# Patient Record
Sex: Female | Born: 1945 | Race: White | Hispanic: No | Smoking: Former smoker
Health system: Southern US, Community
[De-identification: ages and names within clinical notes are randomized; demographics above are authoritative.]

## PROBLEM LIST (undated history)

## (undated) DIAGNOSIS — E785 Hyperlipidemia, unspecified: Secondary | ICD-10-CM

## (undated) DIAGNOSIS — K279 Peptic ulcer, site unspecified, unspecified as acute or chronic, without hemorrhage or perforation: Secondary | ICD-10-CM

## (undated) HISTORY — PX: ABDOMINAL HYSTERECTOMY: SHX81

## (undated) HISTORY — DX: Peptic ulcer, site unspecified, unspecified as acute or chronic, without hemorrhage or perforation: K27.9

## (undated) HISTORY — DX: Hyperlipidemia, unspecified: E78.5

## (undated) HISTORY — PX: APPENDECTOMY: SHX54

## (undated) HISTORY — PX: OTHER SURGICAL HISTORY: SHX169

## (undated) HISTORY — PX: TONSILLECTOMY: SUR1361

---

## 1998-07-23 ENCOUNTER — Ambulatory Visit (HOSPITAL_COMMUNITY): Admission: RE | Admit: 1998-07-23 | Discharge: 1998-07-23 | Payer: Self-pay | Admitting: Gastroenterology

## 1998-07-26 ENCOUNTER — Ambulatory Visit (HOSPITAL_COMMUNITY): Admission: RE | Admit: 1998-07-26 | Discharge: 1998-07-26 | Payer: Self-pay | Admitting: Gastroenterology

## 1998-07-26 ENCOUNTER — Encounter: Payer: Self-pay | Admitting: Gastroenterology

## 1998-09-25 ENCOUNTER — Other Ambulatory Visit: Admission: RE | Admit: 1998-09-25 | Discharge: 1998-09-25 | Payer: Self-pay | Admitting: Obstetrics & Gynecology

## 2003-08-30 ENCOUNTER — Other Ambulatory Visit: Admission: RE | Admit: 2003-08-30 | Discharge: 2003-08-30 | Payer: Self-pay | Admitting: Obstetrics & Gynecology

## 2004-08-06 ENCOUNTER — Emergency Department (HOSPITAL_COMMUNITY): Admission: EM | Admit: 2004-08-06 | Discharge: 2004-08-06 | Payer: Self-pay | Admitting: Emergency Medicine

## 2007-08-16 ENCOUNTER — Emergency Department (HOSPITAL_COMMUNITY): Admission: EM | Admit: 2007-08-16 | Discharge: 2007-08-16 | Payer: Self-pay | Admitting: Emergency Medicine

## 2008-05-15 ENCOUNTER — Ambulatory Visit: Payer: Self-pay | Admitting: Internal Medicine

## 2008-05-17 ENCOUNTER — Encounter: Admission: RE | Admit: 2008-05-17 | Discharge: 2008-05-17 | Payer: Self-pay | Admitting: Internal Medicine

## 2008-05-29 ENCOUNTER — Encounter: Admission: RE | Admit: 2008-05-29 | Discharge: 2008-05-29 | Payer: Self-pay | Admitting: Internal Medicine

## 2008-12-20 ENCOUNTER — Encounter: Admission: RE | Admit: 2008-12-20 | Discharge: 2008-12-20 | Payer: Self-pay | Admitting: Orthopedic Surgery

## 2009-02-26 LAB — HM COLONOSCOPY

## 2010-02-09 ENCOUNTER — Emergency Department (HOSPITAL_COMMUNITY): Admission: EM | Admit: 2010-02-09 | Discharge: 2010-02-09 | Payer: Self-pay | Admitting: Pediatrics

## 2010-02-13 ENCOUNTER — Encounter: Admission: RE | Admit: 2010-02-13 | Discharge: 2010-02-13 | Payer: Self-pay | Admitting: Gastroenterology

## 2010-02-20 ENCOUNTER — Ambulatory Visit (HOSPITAL_COMMUNITY): Admission: RE | Admit: 2010-02-20 | Discharge: 2010-02-20 | Payer: Self-pay | Admitting: Gastroenterology

## 2010-06-09 ENCOUNTER — Encounter: Payer: Self-pay | Admitting: Orthopedic Surgery

## 2010-06-09 ENCOUNTER — Encounter: Payer: Self-pay | Admitting: Internal Medicine

## 2010-08-01 LAB — COMPREHENSIVE METABOLIC PANEL
ALT: 11 U/L (ref 0–35)
AST: 15 U/L (ref 0–37)
Albumin: 3.7 g/dL (ref 3.5–5.2)
Alkaline Phosphatase: 79 U/L (ref 39–117)
BUN: 14 mg/dL (ref 6–23)
CO2: 26 mEq/L (ref 19–32)
Calcium: 9.2 mg/dL (ref 8.4–10.5)
Chloride: 108 mEq/L (ref 96–112)
Creatinine, Ser: 0.63 mg/dL (ref 0.4–1.2)
GFR calc Af Amer: >60 mL/min (ref >60)
GFR calc non Af Amer: >60 mL/min (ref >60)
Glucose, Bld: 101 mg/dL — ABNORMAL HIGH (ref 70–99)
Potassium: 4.6 mEq/L (ref 3.5–5.1)
Sodium: 140 mEq/L (ref 135–145)
Total Bilirubin: 0.4 mg/dL (ref 0.3–1.2)
Total Protein: 7 g/dL (ref 6.0–8.3)

## 2010-08-01 LAB — CBC
HCT: 38.4 % (ref 36.0–46.0)
Hemoglobin: 13.4 g/dL (ref 12.0–15.0)
MCH: 29.8 pg (ref 26.0–34.0)
MCHC: 34.8 g/dL (ref 30.0–36.0)
MCV: 85.5 fL (ref 78.0–100.0)
Platelets: 265 10*3/uL (ref 150–400)
RBC: 4.5 MIL/uL (ref 3.87–5.11)
RDW: 12.8 % (ref 11.5–15.5)
WBC: 6.9 10*3/uL (ref 4.0–10.5)

## 2010-08-01 LAB — DIFFERENTIAL
Basophils Absolute: 0 10*3/uL (ref 0.0–0.1)
Basophils Relative: 0 % (ref 0–1)
Eosinophils Absolute: 0 10*3/uL (ref 0.0–0.7)
Eosinophils Relative: 0 % (ref 0–5)
Lymphocytes Relative: 15 % (ref 12–46)
Lymphs Abs: 1 10*3/uL (ref 0.7–4.0)
Monocytes Absolute: 0.2 10*3/uL (ref 0.1–1.0)
Monocytes Relative: 2 % — ABNORMAL LOW (ref 3–12)
Neutro Abs: 5.6 10*3/uL (ref 1.7–7.7)
Neutrophils Relative %: 82 % — ABNORMAL HIGH (ref 43–77)

## 2010-08-01 LAB — LIPASE, BLOOD: Lipase: 29 U/L (ref 11–59)

## 2010-10-07 IMAGING — CR DG CHEST 2V
2 series · 2 of 2 positions shown · non-contrast
Comparison: 08/16/2007

CLINICAL DATA: Visual disturbance with headaches and cough.

CHEST - 2 VIEW

[view not recorded (1 of 2)]
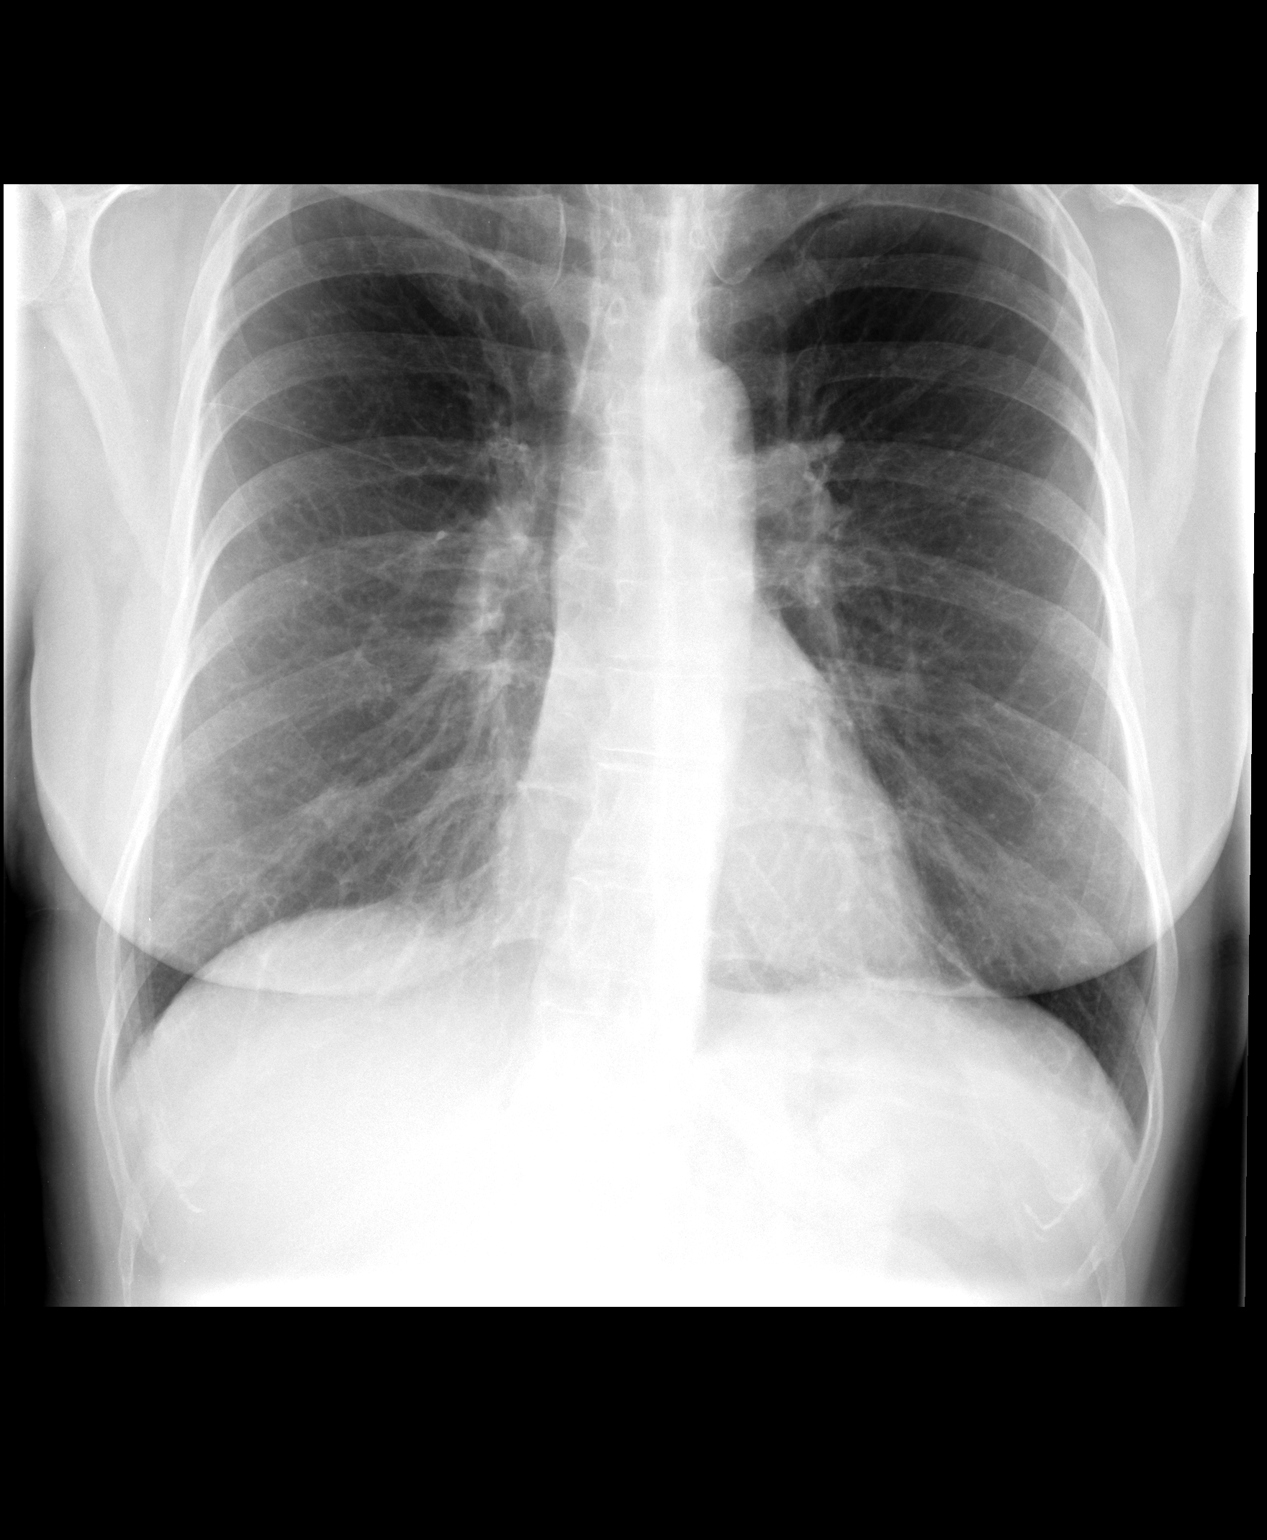

[view not recorded (2 of 2)]
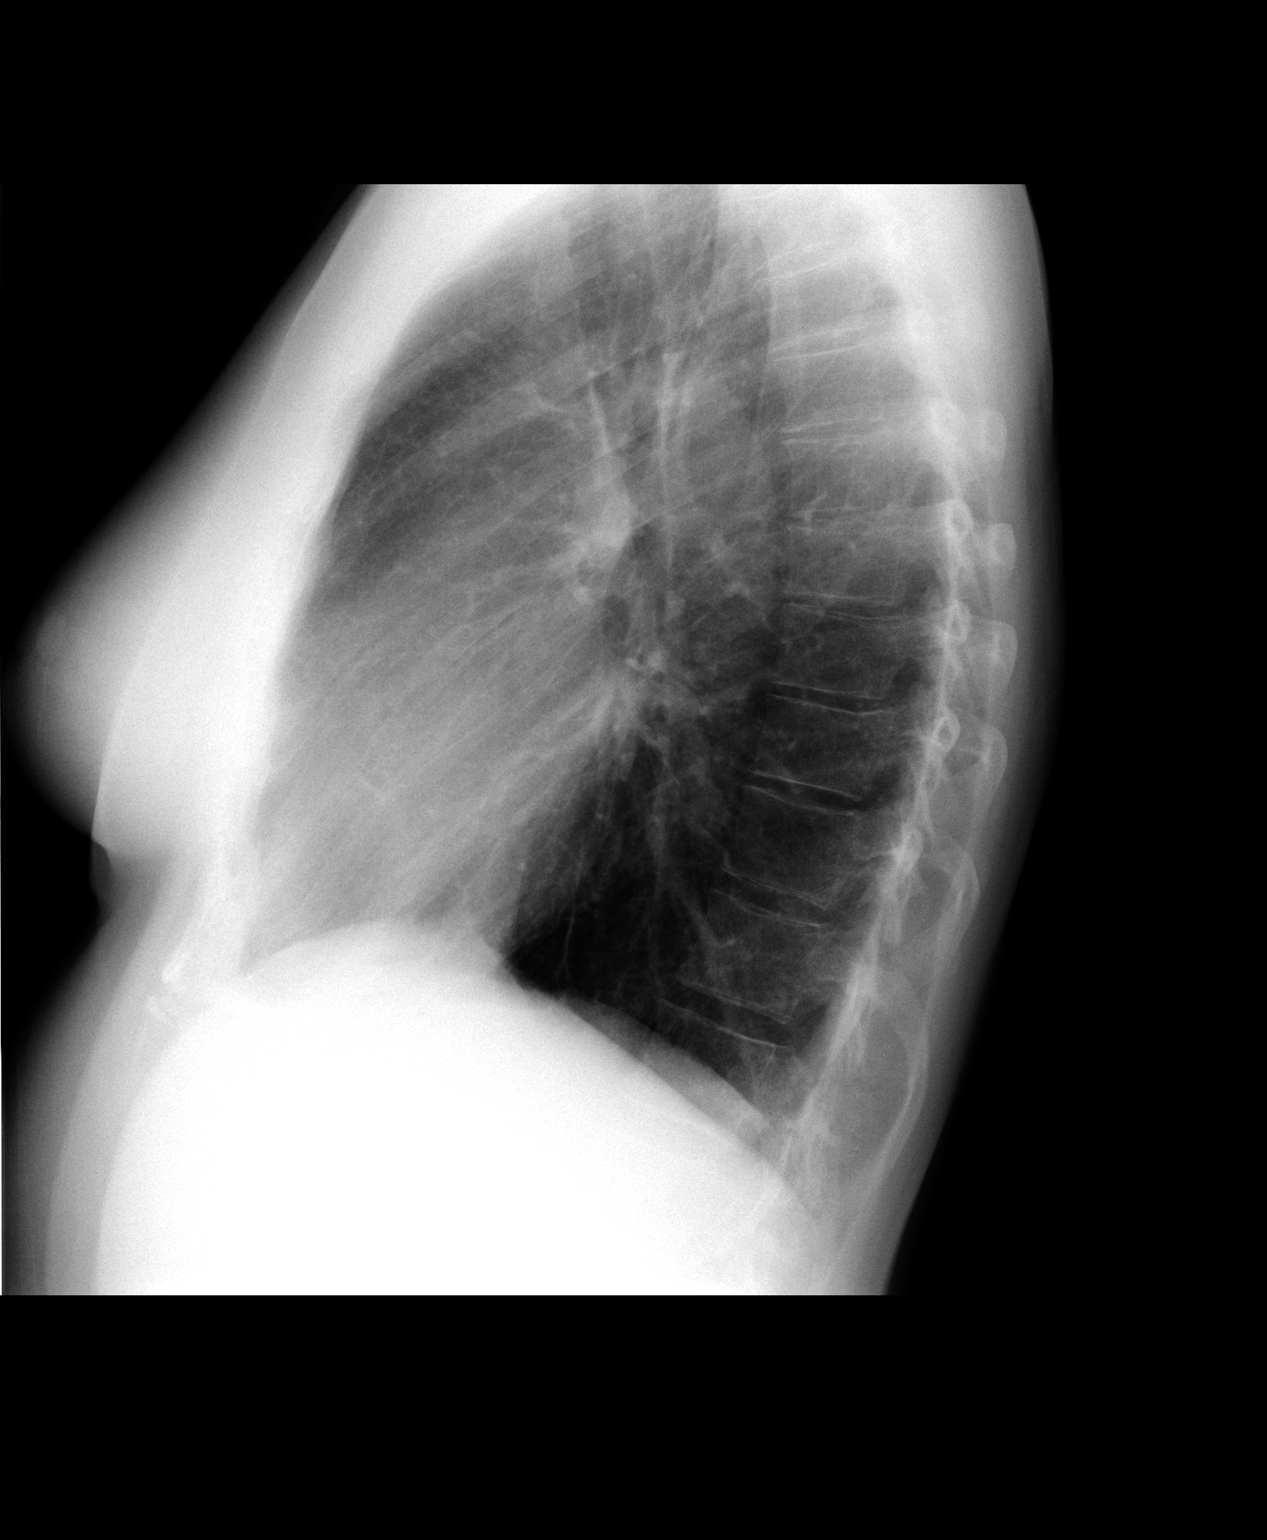

[2 of 2 positions shown; findings below may reference images not displayed]

FINDINGS: Trachea is midline.  Heart size normal.  There is
biapical pleural parenchymal scarring, with slight retraction of
the minor fissure.  Added density is seen in the right lower lung
zone, at the junction of the right 5th anterior and right tenth
posterior ribs.  It is unchanged from the previous study.  Minimal
left basilar scarring.
IMPRESSION: 1.  No acute findings.
2.  Probable summation shadow in the right lower lung zone.  Chest
CT could be performed in further evaluation, as clinically
indicated.

## 2010-10-07 IMAGING — CT CT HEAD WO/W CM
1 of 2 series · 13 of 30 positions shown, 17 images · IV contrast (75CC OMNI 300)
Comparison: CT brain 08/06/2004

CLINICAL DATA: Headaches, visual disturbance for several months
with blurred vision

CT HEAD WITHOUT AND WITH CONTRAST
TECHNIQUE: Contiguous axial images were obtained from the base of
the skull through the vertex without and with intravenous contrast.
Contrast: 75 ml Fmnipaque-OTT

[Series 2: head · axial · 0.49mm/px · z∈[-7,+119]mm · 13 of 28 slices shown, 17 images]
[im 2/28  brain]
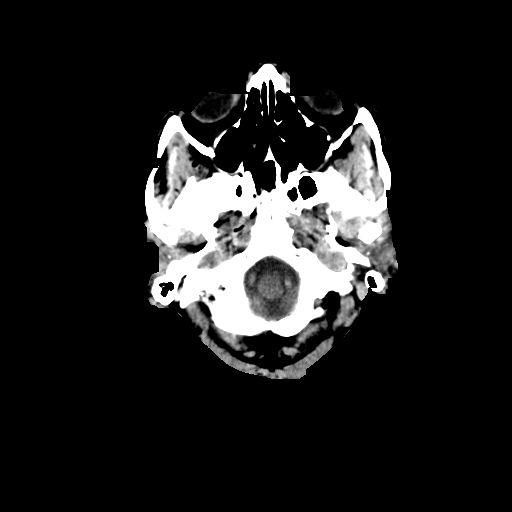
[im 2/28  bone]
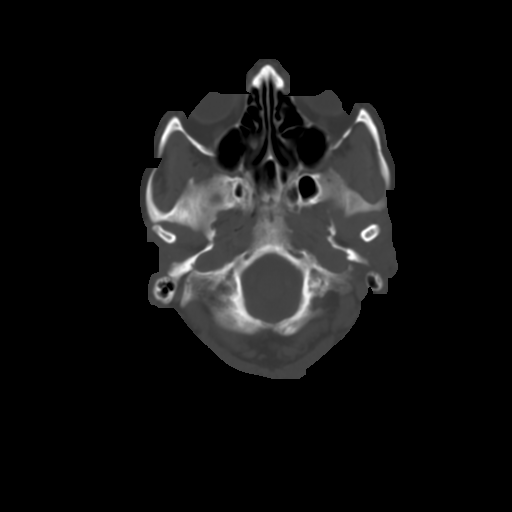
[im 4/28  brain]
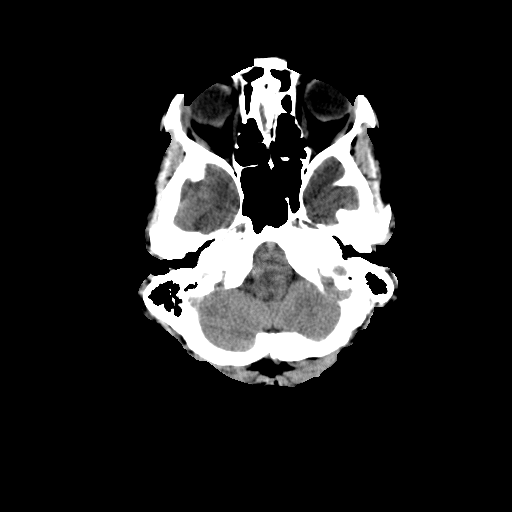
[im 6/28  brain]
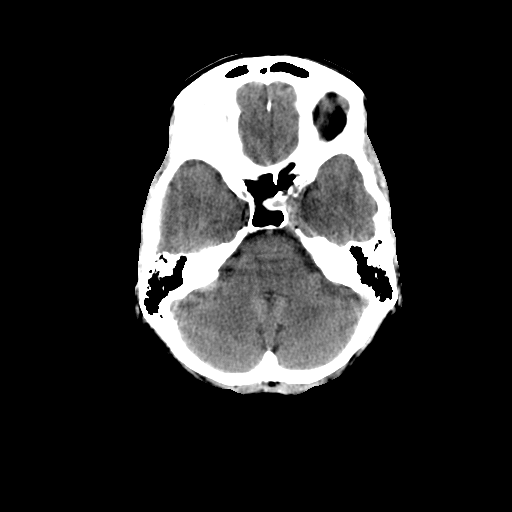
[im 8/28  brain]
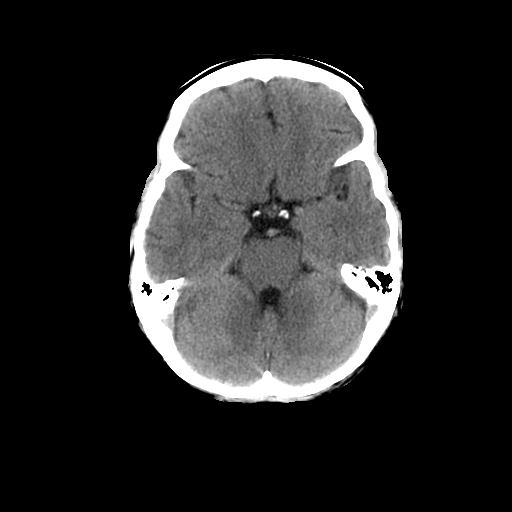
[im 10/28  brain]
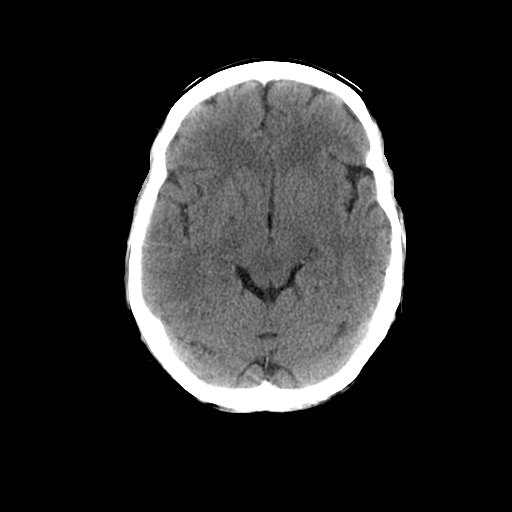
[im 10/28  bone]
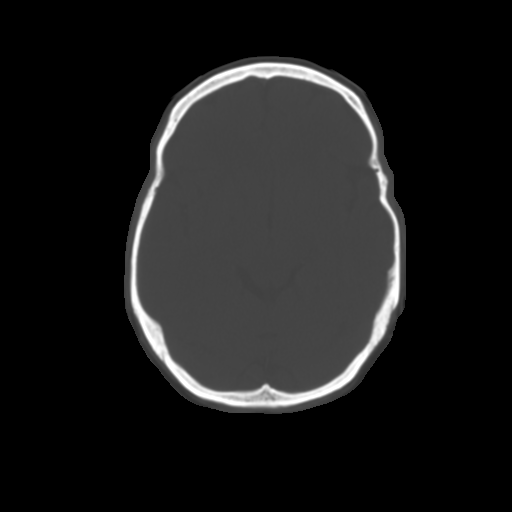
[im 12/28  brain]
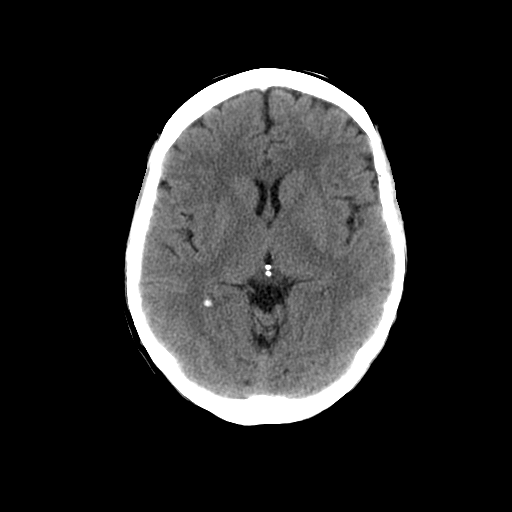
[im 14/28  brain]
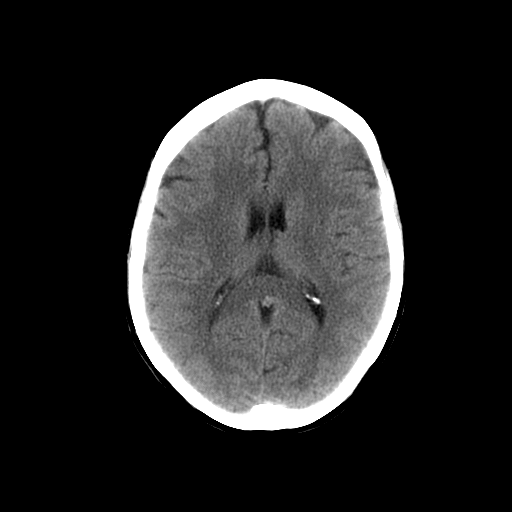
[im 16/28  brain]
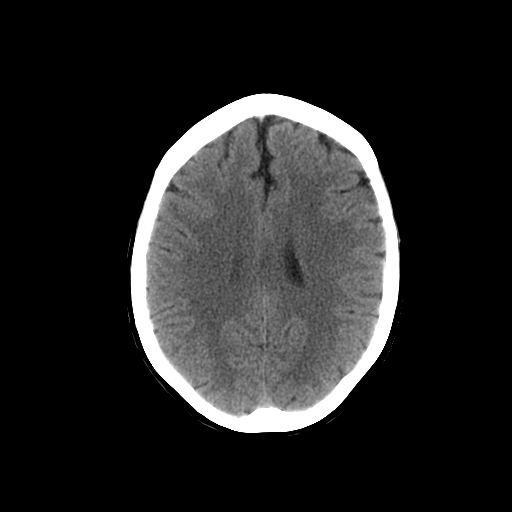
[im 18/28  brain]
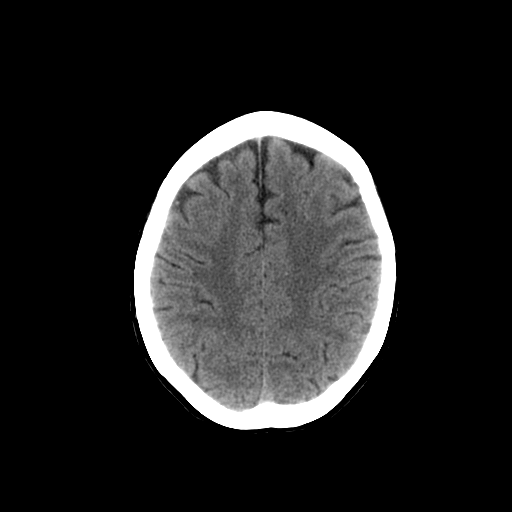
[im 18/28  bone]
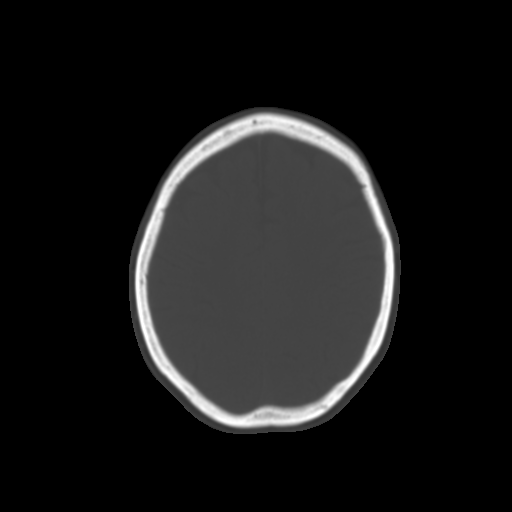
[im 20/28  brain]
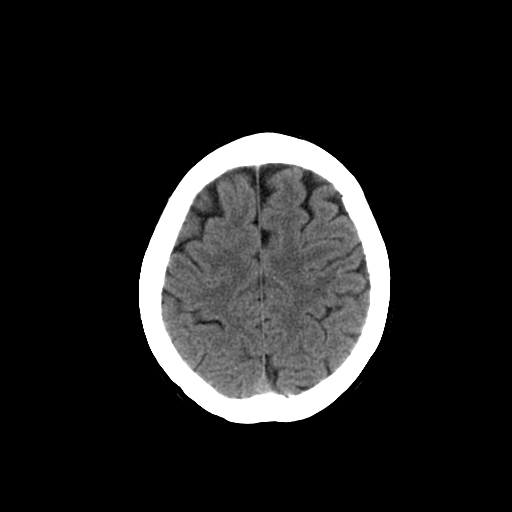
[im 22/28  brain]
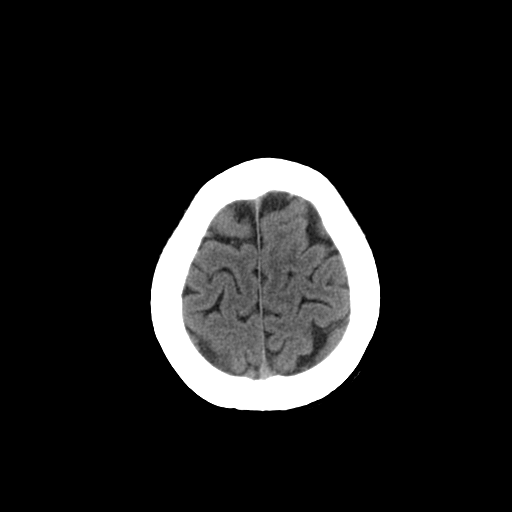
[im 24/28  brain]
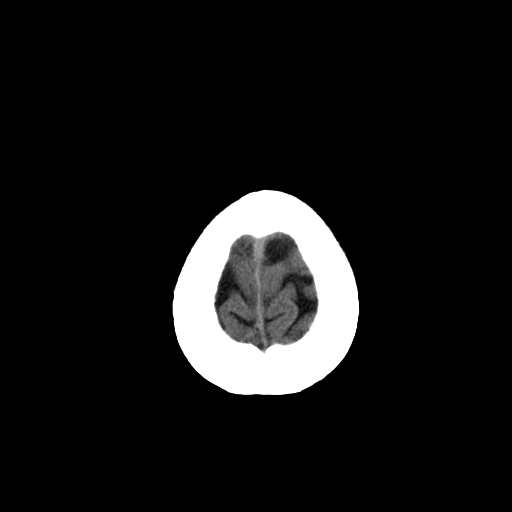
[im 26/28  brain]
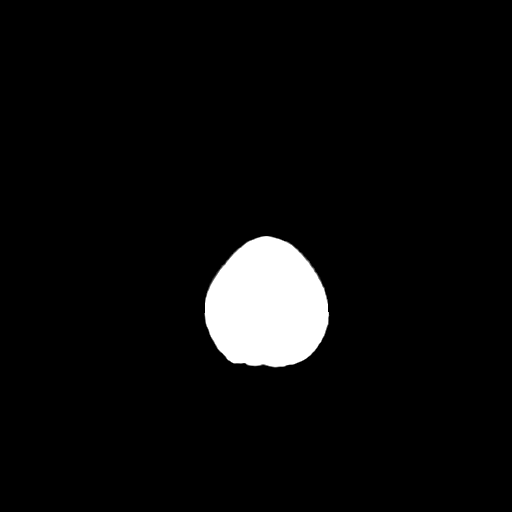
[im 26/28  bone]
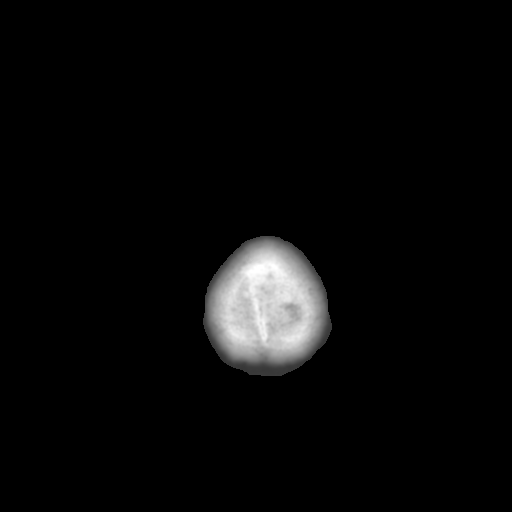

[13 of 30 positions shown; findings below may reference images not displayed]

FINDINGS: The ventricular system is normal in size and
configuration, and the septum is in a normal midline position.  The
fourth ventricle and basilar cisterns appear normal.  No blood,
edema, or mass effect is seen.

After contrast administration, no enhancing lesion is seen.  There
is a rounded area of enhancement  on unenhanced image #9 probably
representing the uppermost portion of the left internal carotid
artery.  However in view of the symptoms, MRI of the brain with MRA
may be helpful to assess further for possible aneurysm.  On bone
window images there is mucosal thickening in the left maxillary
sinus consistent with maxillary sinus disease.  No air-fluid level
is seen.  No bony abnormality is noted.
IMPRESSION: 1.  No acute intracranial abnormality.
2.  Rounded area of enhancement in the region of the left circle
Willis possibly representing a portion of the left internal carotid
artery.  However an aneurysm cannot be excluded and MR of the brain
with MRA is recommended to evaluate further.
3.  Mucosal thickening in the left maxillary sinus.

## 2011-07-03 ENCOUNTER — Encounter: Payer: Self-pay | Admitting: Cardiology

## 2011-07-08 ENCOUNTER — Other Ambulatory Visit: Payer: Self-pay | Admitting: Obstetrics & Gynecology

## 2011-07-08 DIAGNOSIS — R928 Other abnormal and inconclusive findings on diagnostic imaging of breast: Secondary | ICD-10-CM

## 2011-07-09 ENCOUNTER — Ambulatory Visit
Admission: RE | Admit: 2011-07-09 | Discharge: 2011-07-09 | Disposition: A | Discharge status: Home / Self Care | Payer: BC Managed Care – PPO | Source: Ambulatory Visit | Attending: Obstetrics & Gynecology | Admitting: Obstetrics & Gynecology

## 2011-07-09 DIAGNOSIS — R928 Other abnormal and inconclusive findings on diagnostic imaging of breast: Secondary | ICD-10-CM

## 2011-08-01 ENCOUNTER — Encounter: Payer: Self-pay | Admitting: Cardiology

## 2011-08-01 ENCOUNTER — Ambulatory Visit (INDEPENDENT_AMBULATORY_CARE_PROVIDER_SITE_OTHER): Payer: BC Managed Care – PPO | Admitting: Cardiology

## 2011-08-01 DIAGNOSIS — R079 Chest pain, unspecified: Secondary | ICD-10-CM | POA: Insufficient documentation

## 2011-08-01 DIAGNOSIS — E785 Hyperlipidemia, unspecified: Secondary | ICD-10-CM

## 2011-08-01 NOTE — Progress Notes (Signed)
  HPI: 66 year old female with no prior cardiac history for evaluation of chest pain. Patient denies dyspnea on exertion, orthopnea, PND, pedal edema, palpitations, syncope or exertional chest pain. She and her fianc have been remodeling his home. Approximately one to 2 months ago she was pulling up carpet. The following day she developed sharp pain in her chest. The pain increased with raising her arms over her head. It lasted 2 days continuously and slowly resolved with aspirin and a heating pad. No associated symptoms. No radiation. She had a second episode again after pulling up carpet. Because of the above we were asked to further evaluate.  Current Outpatient Prescriptions  Medication Sig Dispense Refill  . aspirin 325 MG tablet Take 325 mg by mouth daily.      . Calcium Carbonate (CALTRATE 600 PO) Take 1,000 Units by mouth daily.      Marland Kitchen estradiol (ESTRACE) 1 MG tablet Take 1 mg by mouth daily.      Marland Kitchen omeprazole (PRILOSEC OTC) 20 MG tablet Take 10 mg by mouth daily.        No Known Allergies  Past Medical History  Diagnosis Date  . Hyperlipidemia   . PUD (peptic ulcer disease)     Past Surgical History  Procedure Date  . Tonsillectomy   . Abdominal hysterectomy   . Appendectomy   . Bladder tack     History   Social History  . Marital Status: Divorced    Spouse Name: N/A    Number of Children: 2   . Years of Education: N/A   Occupational History  .      Retired   Social History Main Topics  . Smoking status: Former Smoker    Quit date: 05/19/1990  . Smokeless tobacco: Not on file  . Alcohol Use: Yes     Rarely  . Drug Use: Not on file  . Sexually Active: Not on file   Other Topics Concern  . Not on file   Social History Narrative  . No narrative on file    Family History  Problem Relation Age of Onset  . Heart disease Father     Pacemaker  . Heart disease Mother     MVR; MI    ROS:  no fevers or chills, productive cough, hemoptysis, dysphasia,  odynophagia, melena, hematochezia, dysuria, hematuria, rash, seizure activity, orthopnea, PND, pedal edema, claudication. Remaining systems are negative.  Physical Exam:   Blood pressure 158/88, pulse 91, height 5\' 6"  (1.676 m), weight 134 lb (60.782 kg).  General:  Well developed/well nourished in NAD Skin warm/dry Patient not depressed No peripheral clubbing Back-normal HEENT-normal/normal eyelids Neck supple/normal carotid upstroke bilaterally; no bruits; no JVD; no thyromegaly chest - CTA/ normal expansion CV - RRR/normal S1 and S2; no murmurs, rubs or gallops;  PMI nondisplaced Abdomen -NT/ND, no HSM, no mass, + bowel sounds, no bruit 2+ femoral pulses, no bruits Ext-no edema, chords, 2+ DP Neuro-grossly nonfocal  ECG normal sinus rhythm at a rate of 91. Axis normal. Poor R-wave progression.

## 2011-08-01 NOTE — Assessment & Plan Note (Signed)
Symptoms are most consistent with musculoskeletal pain. She does not have other symptoms such as exertional chest pain or dyspnea on exertion. Electrocardiogram unremarkable. I do not think further cardiac workup is needed unless she has worsening symptoms in the future.

## 2011-08-01 NOTE — Assessment & Plan Note (Signed)
Management per primary care. 

## 2012-07-08 ENCOUNTER — Other Ambulatory Visit: Payer: Self-pay | Admitting: Obstetrics & Gynecology

## 2012-07-08 DIAGNOSIS — R928 Other abnormal and inconclusive findings on diagnostic imaging of breast: Secondary | ICD-10-CM

## 2012-07-15 ENCOUNTER — Ambulatory Visit
Admission: RE | Admit: 2012-07-15 | Discharge: 2012-07-15 | Disposition: A | Discharge status: Home / Self Care | Payer: BC Managed Care – PPO | Source: Ambulatory Visit | Attending: Obstetrics & Gynecology | Admitting: Obstetrics & Gynecology

## 2012-07-15 DIAGNOSIS — R928 Other abnormal and inconclusive findings on diagnostic imaging of breast: Secondary | ICD-10-CM

## 2013-06-29 ENCOUNTER — Ambulatory Visit (INDEPENDENT_AMBULATORY_CARE_PROVIDER_SITE_OTHER): Payer: Medicare HMO | Admitting: Family

## 2013-06-29 ENCOUNTER — Encounter: Payer: Self-pay | Admitting: Family

## 2013-06-29 VITALS — BP 112/84 | HR 78 | Ht 65.0 in | Wt 141.0 lb

## 2013-06-29 DIAGNOSIS — Z7189 Other specified counseling: Secondary | ICD-10-CM

## 2013-06-29 DIAGNOSIS — Z7689 Persons encountering health services in other specified circumstances: Secondary | ICD-10-CM

## 2013-06-29 DIAGNOSIS — R7689 Other specified abnormal immunological findings in serum: Secondary | ICD-10-CM | POA: Insufficient documentation

## 2013-06-29 DIAGNOSIS — R768 Other specified abnormal immunological findings in serum: Secondary | ICD-10-CM | POA: Insufficient documentation

## 2013-06-29 DIAGNOSIS — K219 Gastro-esophageal reflux disease without esophagitis: Secondary | ICD-10-CM

## 2013-06-29 DIAGNOSIS — B182 Chronic viral hepatitis C: Secondary | ICD-10-CM

## 2013-06-29 NOTE — Patient Instructions (Signed)
Fat and Cholesterol Control Diet  Fat and cholesterol levels in your blood and organs are influenced by your diet. High levels of fat and cholesterol may lead to diseases of the heart, small and large blood vessels, gallbladder, liver, and pancreas.  CONTROLLING FAT AND CHOLESTEROL WITH DIET  Although exercise and lifestyle factors are important, your diet is key. That is because certain foods are known to raise cholesterol and others to lower it. The goal is to balance foods for their effect on cholesterol and more importantly, to replace saturated and trans fat with other types of fat, such as monounsaturated fat, polyunsaturated fat, and omega-3 fatty acids.  On average, a person should consume no more than 15 to 17 g of saturated fat daily. Saturated and trans fats are considered "bad" fats, and they will raise LDL cholesterol. Saturated fats are primarily found in animal products such as meats, butter, and cream. However, that does not mean you need to give up all your favorite foods. Today, there are good tasting, low-fat, low-cholesterol substitutes for most of the things you like to eat. Choose low-fat or nonfat alternatives. Choose round or loin cuts of red meat. These types of cuts are lowest in fat and cholesterol. Chicken (without the skin), fish, veal, and ground turkey breast are great choices. Eliminate fatty meats, such as hot dogs and salami. Even shellfish have little or no saturated fat. Have a 3 oz (85 g) portion when you eat lean meat, poultry, or fish.  Trans fats are also called "partially hydrogenated oils." They are oils that have been scientifically manipulated so that they are solid at room temperature resulting in a longer shelf life and improved taste and texture of foods in which they are added. Trans fats are found in stick margarine, some tub margarines, cookies, crackers, and baked goods.   When baking and cooking, oils are a great substitute for butter. The monounsaturated oils are  especially beneficial since it is believed they lower LDL and raise HDL. The oils you should avoid entirely are saturated tropical oils, such as coconut and palm.   Remember to eat a lot from food groups that are naturally free of saturated and trans fat, including fish, fruit, vegetables, beans, grains (barley, rice, couscous, bulgur wheat), and pasta (without cream sauces).   IDENTIFYING FOODS THAT LOWER FAT AND CHOLESTEROL   Soluble fiber may lower your cholesterol. This type of fiber is found in fruits such as apples, vegetables such as broccoli, potatoes, and carrots, legumes such as beans, peas, and lentils, and grains such as barley. Foods fortified with plant sterols (phytosterol) may also lower cholesterol. You should eat at least 2 g per day of these foods for a cholesterol lowering effect.   Read package labels to identify low-saturated fats, trans fat free, and low-fat foods at the supermarket. Select cheeses that have only 2 to 3 g saturated fat per ounce. Use a heart-healthy tub margarine that is free of trans fats or partially hydrogenated oil. When buying baked goods (cookies, crackers), avoid partially hydrogenated oils. Breads and muffins should be made from whole grains (whole-wheat or whole oat flour, instead of "flour" or "enriched flour"). Buy non-creamy canned soups with reduced salt and no added fats.   FOOD PREPARATION TECHNIQUES   Never deep-fry. If you must fry, either stir-fry, which uses very little fat, or use non-stick cooking sprays. When possible, broil, bake, or roast meats, and steam vegetables. Instead of putting butter or margarine on vegetables, use lemon   yogurt, salsa, and low-fat dressings for salads.  LOW-SATURATED FAT / LOW-FAT FOOD SUBSTITUTES Meats / Saturated Fat (g)  Avoid: Steak, marbled (3 oz/85 g) / 11 g  Choose: Steak, lean (3 oz/85 g) / 4 g  Avoid: Hamburger (3 oz/85 g) / 7  g  Choose: Hamburger, lean (3 oz/85 g) / 5 g  Avoid: Ham (3 oz/85 g) / 6 g  Choose: Ham, lean cut (3 oz/85 g) / 2.4 g  Avoid: Chicken, with skin, dark meat (3 oz/85 g) / 4 g  Choose: Chicken, skin removed, dark meat (3 oz/85 g) / 2 g  Avoid: Chicken, with skin, light meat (3 oz/85 g) / 2.5 g  Choose: Chicken, skin removed, light meat (3 oz/85 g) / 1 g Dairy / Saturated Fat (g)  Avoid: Whole milk (1 cup) / 5 g  Choose: Low-fat milk, 2% (1 cup) / 3 g  Choose: Low-fat milk, 1% (1 cup) / 1.5 g  Choose: Skim milk (1 cup) / 0.3 g  Avoid: Hard cheese (1 oz/28 g) / 6 g  Choose: Skim milk cheese (1 oz/28 g) / 2 to 3 g  Avoid: Cottage cheese, 4% fat (1 cup) / 6.5 g  Choose: Low-fat cottage cheese, 1% fat (1 cup) / 1.5 g  Avoid: Ice cream (1 cup) / 9 g  Choose: Sherbet (1 cup) / 2.5 g  Choose: Nonfat frozen yogurt (1 cup) / 0.3 g  Choose: Frozen fruit bar / trace  Avoid: Whipped cream (1 tbs) / 3.5 g  Choose: Nondairy whipped topping (1 tbs) / 1 g Condiments / Saturated Fat (g)  Avoid: Mayonnaise (1 tbs) / 2 g  Choose: Low-fat mayonnaise (1 tbs) / 1 g  Avoid: Butter (1 tbs) / 7 g  Choose: Extra light margarine (1 tbs) / 1 g  Avoid: Coconut oil (1 tbs) / 11.8 g  Choose: Olive oil (1 tbs) / 1.8 g  Choose: Corn oil (1 tbs) / 1.7 g  Choose: Safflower oil (1 tbs) / 1.2 g  Choose: Sunflower oil (1 tbs) / 1.4 g  Choose: Soybean oil (1 tbs) / 2.4 g  Choose: Canola oil (1 tbs) / 1 g Document Released: 05/05/2005 Document Revised: 08/30/2012 Document Reviewed: 10/24/2010 [employer] Patient Information 2014 [employer].    Exercise to Stay Healthy Exercise helps you become and stay healthy. EXERCISE IDEAS AND TIPS Choose exercises that:  You enjoy.  Fit into your day. You do not need to exercise really hard to be healthy. You can do exercises at a slow or medium level and stay healthy. You can:  Stretch before and after working out.  Try yoga, Pilates,  or tai chi.  Lift weights.  Walk fast, swim, jog, run, climb stairs, bicycle, dance, or rollerskate.  Take aerobic classes. Exercises that burn about 150 calories:  Running 1  miles in 15 minutes.  Playing volleyball for 45 to 60 minutes.  Washing and waxing a car for 45 to 60 minutes.  Playing touch football for 45 minutes.  Walking 1  miles in 35 minutes.  Pushing a stroller 1  miles in 30 minutes.  Playing basketball for 30 minutes.  Raking leaves for 30 minutes.  Bicycling 5 miles in 30 minutes.  Walking 2 miles in 30 minutes.  Dancing for 30 minutes.  Shoveling snow for 15 minutes.  Swimming laps for 20 minutes.  Walking up stairs for 15 minutes.  Bicycling 4 miles in 15 minutes.  Gardening for 30 to   45 minutes.  Jumping rope for 15 minutes.  Washing windows or floors for 45 to 60 minutes. Document Released: 06/07/2010 Document Revised: 07/28/2011 Document Reviewed: 06/07/2010 [employer] Patient Information 2014 [employer].  

## 2013-06-29 NOTE — Progress Notes (Signed)
Pre visit review using our clinic review tool, if applicable. No additional management support is needed unless otherwise documented below in the visit note. 

## 2013-06-29 NOTE — Progress Notes (Signed)
Subjective:    Patient ID: Madison Blair, female    DOB: 02/13/1946, 68 y.o.   MRN: [id number]  HPI 68 y.o. White female who presents today to establish care. Pt acknowledges a history of "stomach ulcer" which she developed 11 years ago, hepatitis which she states is "resolved. I got it from a needle stick at work 32 years ago". Pt has had a hysterectomy in 1993, and had an appendectomy as a child. She is up to date on immunizations, would like to receive the Shingles and Pneumonia vaccine. Gets mammograms and pelvic exams yearly. Up to date on colonoscopy and get yearly eye exams. Pt exercises regularly and eats a "fairly healthy diet", she admits she drinks sugar soda frequently. Denies smoking, and illicit drug use, drinks 2 glasses of alcohol per month. Denies any complaints, denies fatigue, chills, fever, weight change.     Review of Systems  Constitutional: Negative.   HENT: Negative.   Eyes: Negative.   Respiratory: Negative.   Cardiovascular: Negative.   Gastrointestinal: Negative.   Endocrine: Negative.   Genitourinary: Negative.   Musculoskeletal: Negative.   Skin: Negative.   Allergic/Immunologic: Negative.   Neurological: Negative.   Hematological: Negative.   Psychiatric/Behavioral: Negative.    Past Medical History  Diagnosis Date  . Hyperlipidemia   . PUD (peptic ulcer disease)     History   Social History  . Marital Status: Divorced    Spouse Name: N/A    Number of Children: 2   . Years of Education: N/A   Occupational History  .      Retired   Social History Main Topics  . Smoking status: Former Smoker    Quit date: 05/19/1990  . Smokeless tobacco: Not on file  . Alcohol Use: Yes     Comment: Rarely  . Drug Use: Not on file  . Sexual Activity: Not on file   Other Topics Concern  . Not on file   Social History Narrative  . No narrative on file    Past Surgical History  Procedure Laterality Date  . Tonsillectomy    . Abdominal  hysterectomy    . Appendectomy    . Bladder tack      Family History  Problem Relation Age of Onset  . Heart disease Father     Pacemaker  . Heart disease Mother     MVR; MI  . Breast cancer Mother     No Known Allergies  Current Outpatient Prescriptions on File Prior to Visit  Medication Sig Dispense Refill  . aspirin 325 MG tablet Take 325 mg by mouth daily.      Marland Kitchen estradiol (ESTRACE) 1 MG tablet Take 1 mg by mouth daily.      Marland Kitchen omeprazole (PRILOSEC OTC) 20 MG tablet Take 10 mg by mouth daily.      . Calcium Carbonate (CALTRATE 600 PO) Take 1,000 Units by mouth daily.       No current facility-administered medications on file prior to visit.    BP 112/84  Pulse 78  Ht 5\' 5"  (1.651 m)  Wt 141 lb (63.957 kg)  BMI 23.46 kg/m2chart    Objective:   Physical Exam  Constitutional: She is oriented to person, place, and time. She appears well-developed and well-nourished. She is active.  HENT:  Head: Normocephalic.  Cardiovascular: Normal rate, regular rhythm and normal heart sounds.   Pulmonary/Chest: Effort normal and breath sounds normal.  Abdominal: Soft. Normal appearance and bowel sounds are  normal.  Neurological: She is alert and oriented to person, place, and time.  Skin: Skin is warm, dry and intact.  Psychiatric: She has a normal mood and affect. Her speech is normal and behavior is normal.          Assessment & Plan:  68 y.o. White female presents today to establish care.  - Physical Assessment: will need to schedule time for physical.   - Will also get yearly lab work at that time and an EKG.  - Vaccine: Will get Pneumonia Vaccine and Shingles vaccine at physical.  - Education: Try to limit caffeine and sugar soda, switch to diet soda if possible. Be sure to drink plenty of water.   -Excise regularly.  Follow up- For physical as soon as she has time available.

## 2013-07-04 ENCOUNTER — Ambulatory Visit (INDEPENDENT_AMBULATORY_CARE_PROVIDER_SITE_OTHER): Payer: Medicare HMO | Admitting: Family

## 2013-07-04 ENCOUNTER — Encounter: Payer: Self-pay | Admitting: Family

## 2013-07-04 VITALS — BP 122/76 | HR 87 | Ht 65.0 in | Wt 141.0 lb

## 2013-07-04 DIAGNOSIS — K219 Gastro-esophageal reflux disease without esophagitis: Secondary | ICD-10-CM

## 2013-07-04 DIAGNOSIS — Z23 Encounter for immunization: Secondary | ICD-10-CM

## 2013-07-04 DIAGNOSIS — Z0000 Encounter for general adult medical examination without abnormal findings: Secondary | ICD-10-CM

## 2013-07-04 DIAGNOSIS — E789 Disorder of lipoprotein metabolism, unspecified: Secondary | ICD-10-CM

## 2013-07-04 DIAGNOSIS — E785 Hyperlipidemia, unspecified: Secondary | ICD-10-CM

## 2013-07-04 DIAGNOSIS — E559 Vitamin D deficiency, unspecified: Secondary | ICD-10-CM

## 2013-07-04 LAB — POCT URINALYSIS DIPSTICK
Bilirubin, UA: NEGATIVE
Blood, UA: NEGATIVE
Glucose, UA: NEGATIVE
Ketones, UA: NEGATIVE
Leukocytes, UA: NEGATIVE
Nitrite, UA: NEGATIVE
Spec Grav, UA: >=1.03
Urobilinogen, UA: 0.2
pH, UA: 5.5

## 2013-07-04 LAB — LIPID PANEL
Cholesterol: 253 mg/dL — ABNORMAL HIGH (ref 0–200)
HDL: 61.3 mg/dL (ref >39.00)
Total CHOL/HDL Ratio: 4
Triglycerides: 163 mg/dL — ABNORMAL HIGH (ref 0.0–149.0)
VLDL: 32.6 mg/dL (ref 0.0–40.0)

## 2013-07-04 LAB — CBC WITH DIFFERENTIAL/PLATELET
Basophils Absolute: 0 10*3/uL (ref 0.0–0.1)
Basophils Relative: 0.6 % (ref 0.0–3.0)
Eosinophils Absolute: 0.1 10*3/uL (ref 0.0–0.7)
Eosinophils Relative: 1.7 % (ref 0.0–5.0)
HCT: 40 % (ref 36.0–46.0)
Hemoglobin: 13.2 g/dL (ref 12.0–15.0)
Lymphocytes Relative: 36.3 % (ref 12.0–46.0)
Lymphs Abs: 1.6 10*3/uL (ref 0.7–4.0)
MCHC: 32.9 g/dL (ref 30.0–36.0)
MCV: 86 fl (ref 78.0–100.0)
Monocytes Absolute: 0.3 10*3/uL (ref 0.1–1.0)
Monocytes Relative: 7.8 % (ref 3.0–12.0)
Neutro Abs: 2.4 10*3/uL (ref 1.4–7.7)
Neutrophils Relative %: 53.6 % (ref 43.0–77.0)
Platelets: 265 10*3/uL (ref 150.0–400.0)
RBC: 4.65 Mil/uL (ref 3.87–5.11)
RDW: 13.4 % (ref 11.5–14.6)
WBC: 4.4 10*3/uL — ABNORMAL LOW (ref 4.5–10.5)

## 2013-07-04 LAB — HEPATIC FUNCTION PANEL
ALT: 10 U/L (ref 0–35)
AST: 12 U/L (ref 0–37)
Albumin: 3.8 g/dL (ref 3.5–5.2)
Alkaline Phosphatase: 70 U/L (ref 39–117)
Bilirubin, Direct: 0 mg/dL (ref 0.0–0.3)
Total Bilirubin: 0.5 mg/dL (ref 0.3–1.2)
Total Protein: 7.3 g/dL (ref 6.0–8.3)

## 2013-07-04 LAB — BASIC METABOLIC PANEL
BUN: 21 mg/dL (ref 6–23)
CO2: 24 mEq/L (ref 19–32)
Calcium: 9.3 mg/dL (ref 8.4–10.5)
Chloride: 109 mEq/L (ref 96–112)
Creatinine, Ser: 0.7 mg/dL (ref 0.4–1.2)
GFR: 87.75 mL/min (ref >60.00)
Glucose, Bld: 91 mg/dL (ref 70–99)
Potassium: 4.5 mEq/L (ref 3.5–5.1)
Sodium: 142 mEq/L (ref 135–145)

## 2013-07-04 LAB — LDL CHOLESTEROL, DIRECT: Direct LDL: 171.8 mg/dL

## 2013-07-04 LAB — TSH: TSH: 1.7 u[IU]/mL (ref 0.35–5.50)

## 2013-07-04 NOTE — Patient Instructions (Signed)
Vitamin D Deficiency  Vitamin D is an important vitamin that your body needs. Having too little of it in your body is called a deficiency. A very bad deficiency can make your bones soft and can cause a condition called rickets.   Vitamin D is important to your body for different reasons, such as:   · It helps your body absorb 2 minerals called calcium and phosphorus.  · It helps make your bones healthy.  · It may prevent some diseases, such as diabetes and multiple sclerosis.  · It helps your muscles and heart.  You can get vitamin D in several ways. It is a natural part of some foods. The vitamin is also added to some dairy products and cereals. Some people take vitamin D supplements. Also, your body makes vitamin D when you are in the sun. It changes the sun's rays into a form of the vitamin that your body can use.  CAUSES   · Not eating enough foods that contain vitamin D.  · Not getting enough sunlight.  · Having certain digestive system diseases that make it hard to absorb vitamin D. These diseases include Crohn's disease, chronic pancreatitis, and cystic fibrosis.  · Having a surgery in which part of the stomach or small intestine is removed.  · Being obese. Fat cells pull vitamin D out of your blood. That means that obese people may not have enough vitamin D left in their blood and in other body tissues.  · Having chronic kidney or liver disease.  RISK FACTORS  Risk factors are things that make you more likely to develop a vitamin D deficiency. They include:  · Being older.  · Not being able to get outside very much.  · Living in a nursing home.  · Having had broken bones.  · Having weak or thin bones (osteoporosis).  · Having a disease or condition that changes how your body absorbs vitamin D.  · Having dark skin.  · Some medicines such as seizure medicines or steroids.  · Being overweight or obese.  SYMPTOMS  Mild cases of vitamin D deficiency may not have any symptoms. If you have a very bad case, symptoms  may include:  · Bone pain.  · Muscle pain.  · Falling often.  · Broken bones caused by a minor injury, due to osteoporosis.  DIAGNOSIS  A blood test is the best way to tell if you have a vitamin D deficiency.  TREATMENT  Vitamin D deficiency can be treated in different ways. Treatment for vitamin D deficiency depends on what is causing it. Options include:  · Taking vitamin D supplements.  · Taking a calcium supplement. Your caregiver will suggest what dose is best for you.  HOME CARE INSTRUCTIONS  · Take any supplements that your caregiver prescribes. Follow the directions carefully. Take only the suggested amount.  · Have your blood tested 2 months after you start taking supplements.  · Eat foods that contain vitamin D. Healthy choices include:  · Fortified dairy products, cereals, or juices. Fortified means vitamin D has been added to the food. Check the label on the package to be sure.  · Fatty fish like salmon or trout.  · Eggs.  · Oysters.  · Do not use a tanning bed.  · Keep your weight at a healthy level. Lose weight if you need to.  · Keep all follow-up appointments. Your caregiver will need to perform blood tests to make sure your vitamin D deficiency   is going away.  SEEK MEDICAL CARE IF:  · You have any questions about your treatment.  · You continue to have symptoms of vitamin D deficiency.  · You have nausea or vomiting.  · You are constipated.  · You feel confused.  · You have severe abdominal or back pain.  MAKE SURE YOU:  · Understand these instructions.  · Will watch your condition.  · Will get help right away if you are not doing well or get worse.  Document Released: 07/28/2011 Document Revised: 08/30/2012 Document Reviewed: 07/28/2011  [employer]® Patient Information ©2014 [employer].

## 2013-07-04 NOTE — Addendum Note (Signed)
Addended by: Santiago Bumpers on: 07/04/2013 09:42 AM   Modules accepted: Orders

## 2013-07-04 NOTE — Progress Notes (Signed)
Subjective:    Patient ID: Madison Blair, female    DOB: 02-07-1946, 68 y.o.   MRN: [id number]  HPI This is a routine physical examination for this healthy  Female. Reviewed all health maintenance protocols including mammography colonoscopy bone density and reviewed appropriate screening labs. Her immunization history was reviewed as well as her current medications and allergies refills of her chronic medications were given and the plan for yearly health maintenance was discussed all orders and referrals were made as appropriate.   Review of Systems  Constitutional: Negative.   HENT: Negative.   Eyes: Negative.   Respiratory: Negative.   Cardiovascular: Negative.   Gastrointestinal: Negative.   Endocrine: Negative.   Genitourinary: Negative.   Musculoskeletal: Negative.   Skin: Negative.   Allergic/Immunologic: Negative.   Neurological: Negative.   Hematological: Negative.   Psychiatric/Behavioral: Negative.    Past Medical History  Diagnosis Date  . Hyperlipidemia   . PUD (peptic ulcer disease)     History   Social History  . Marital Status: Divorced    Spouse Name: N/A    Number of Children: 2   . Years of Education: N/A   Occupational History  .      Retired   Social History Main Topics  . Smoking status: Former Smoker    Quit date: 05/19/1990  . Smokeless tobacco: Not on file  . Alcohol Use: Yes     Comment: Rarely  . Drug Use: Not on file  . Sexual Activity: Not on file   Other Topics Concern  . Not on file   Social History Narrative  . No narrative on file    Past Surgical History  Procedure Laterality Date  . Tonsillectomy    . Abdominal hysterectomy    . Appendectomy    . Bladder tack      Family History  Problem Relation Age of Onset  . Heart disease Father     Pacemaker  . Heart disease Mother     MVR; MI  . Breast cancer Mother     No Known Allergies  Current Outpatient Prescriptions on File Prior to Visit  Medication Sig  Dispense Refill  . aspirin 325 MG tablet Take 325 mg by mouth daily.      . Calcium Carbonate (CALTRATE 600 PO) Take 1,000 Units by mouth daily.      Marland Kitchen estradiol (ESTRACE) 1 MG tablet Take 1 mg by mouth daily.      Marland Kitchen omeprazole (PRILOSEC OTC) 20 MG tablet Take 10 mg by mouth daily.       No current facility-administered medications on file prior to visit.    BP 122/76  Pulse 87  Ht 5\' 5"  (1.651 m)  Wt 141 lb (63.957 kg)  BMI 23.46 kg/m2chart     Objective:   Physical Exam  Constitutional: She is oriented to person, place, and time. She appears well-developed and well-nourished.  HENT:  Head: Normocephalic.  Right Ear: External ear normal.  Left Ear: External ear normal.  Nose: Nose normal.  Mouth/Throat: Oropharynx is clear and moist.  Eyes: Conjunctivae and EOM are normal. Pupils are equal, round, and reactive to light.  Neck: Normal range of motion. Neck supple. No thyromegaly present.  Cardiovascular: Normal rate, regular rhythm and normal heart sounds.   Pulmonary/Chest: Effort normal and breath sounds normal.  Abdominal: Soft. Bowel sounds are normal. She exhibits no distension. There is no tenderness. There is no rebound.  Musculoskeletal: Normal range of motion. She  exhibits no edema and no tenderness.  Neurological: She is alert and oriented to person, place, and time. She has normal reflexes. She displays normal reflexes. No cranial nerve deficit. Coordination normal.  Skin: Skin is warm and dry.  Psychiatric: She has a normal mood and affect.          Assessment & Plan:  Madison Blair was seen today for annual exam.  Diagnoses and associated orders for this visit:  Preventative health care - Basic Metabolic Panel - Hepatic Function Panel - CBC with Differential - Lipid Panel - TSH - POCT urinalysis dipstick - EKG 12-Lead - Vitamin D, 1,25-dihydroxy  GERD (gastroesophageal reflux disease) - Basic Metabolic Panel - Hepatic Function Panel - CBC with  Differential - Lipid Panel - TSH - POCT urinalysis dipstick - EKG 12-Lead  Unspecified vitamin D deficiency - Vitamin D, 1,25-dihydroxy   Encouraged a healthy diet, exercise. Follow-up in 1 year and sooner as needed.

## 2013-07-05 LAB — VITAMIN D 25 HYDROXY (VIT D DEFICIENCY, FRACTURES): Vit D, 25-Hydroxy: 23 ng/mL — ABNORMAL LOW (ref 30–89)

## 2013-07-07 ENCOUNTER — Telehealth: Payer: Self-pay

## 2013-07-07 MED ORDER — ATORVASTATIN CALCIUM 10 MG PO TABS: 10.0000 mg | ORAL_TABLET | Freq: Every day | ORAL | Status: DC

## 2013-07-07 NOTE — Telephone Encounter (Signed)
Pt aware of lab results and Lipitor sent to pharmacy, per [facility]

## 2013-07-12 ENCOUNTER — Other Ambulatory Visit: Payer: Self-pay | Admitting: Obstetrics & Gynecology

## 2013-07-12 DIAGNOSIS — R928 Other abnormal and inconclusive findings on diagnostic imaging of breast: Secondary | ICD-10-CM

## 2013-07-25 ENCOUNTER — Ambulatory Visit
Admission: RE | Admit: 2013-07-25 | Discharge: 2013-07-25 | Disposition: A | Discharge status: Home / Self Care | Payer: Medicare HMO | Source: Ambulatory Visit | Attending: Obstetrics & Gynecology | Admitting: Obstetrics & Gynecology

## 2013-07-25 ENCOUNTER — Ambulatory Visit
Admission: RE | Admit: 2013-07-25 | Discharge: 2013-07-25 | Disposition: A | Discharge status: Home / Self Care | Payer: Self-pay | Source: Ambulatory Visit | Attending: Obstetrics & Gynecology | Admitting: Obstetrics & Gynecology

## 2013-07-25 DIAGNOSIS — R928 Other abnormal and inconclusive findings on diagnostic imaging of breast: Secondary | ICD-10-CM

## 2013-10-17 ENCOUNTER — Encounter: Payer: Self-pay | Admitting: Family

## 2013-10-17 ENCOUNTER — Ambulatory Visit (INDEPENDENT_AMBULATORY_CARE_PROVIDER_SITE_OTHER): Payer: Medicare HMO | Admitting: Family

## 2013-10-17 VITALS — BP 92/60 | HR 67 | Temp 98.0°F | Ht 65.0 in | Wt 137.0 lb

## 2013-10-17 DIAGNOSIS — G44209 Tension-type headache, unspecified, not intractable: Secondary | ICD-10-CM

## 2013-10-17 DIAGNOSIS — E785 Hyperlipidemia, unspecified: Secondary | ICD-10-CM

## 2013-10-17 DIAGNOSIS — F411 Generalized anxiety disorder: Secondary | ICD-10-CM
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
## 2013-10-17 NOTE — Patient Instructions (Signed)

## 2013-10-17 NOTE — Progress Notes (Signed)
Pre visit review using our clinic review tool, if applicable. No additional management support is needed unless otherwise documented below in the visit note. 

## 2014-07-04 ENCOUNTER — Telehealth: Payer: Self-pay | Admitting: Family

## 2014-07-04 NOTE — Telephone Encounter (Signed)
Pt has been sch for 07-13-14

## 2014-07-04 NOTE — Telephone Encounter (Signed)
Pt called to ask if  Abby Potash would do her physical for this year. Her physical can be done this month.

## 2014-07-04 NOTE — Telephone Encounter (Signed)
lmovm to c/b and schedule

## 2014-07-04 NOTE — Telephone Encounter (Signed)
Ok to schedule.

## 2014-07-13 ENCOUNTER — Encounter: Payer: Self-pay | Admitting: Family

## 2014-07-13 ENCOUNTER — Ambulatory Visit (INDEPENDENT_AMBULATORY_CARE_PROVIDER_SITE_OTHER): Payer: Medicare HMO | Admitting: Family

## 2014-07-13 ENCOUNTER — Telehealth: Payer: Self-pay | Admitting: Family

## 2014-07-13 ENCOUNTER — Other Ambulatory Visit: Payer: Self-pay | Admitting: Family

## 2014-07-13 VITALS — BP 120/72 | HR 92 | Temp 98.0°F | Ht 65.5 in | Wt 130.2 lb

## 2014-07-13 DIAGNOSIS — E7800 Pure hypercholesterolemia, unspecified: Secondary | ICD-10-CM

## 2014-07-13 DIAGNOSIS — K219 Gastro-esophageal reflux disease without esophagitis: Secondary | ICD-10-CM

## 2014-07-13 DIAGNOSIS — Z0000 Encounter for general adult medical examination without abnormal findings: Secondary | ICD-10-CM

## 2014-07-13 LAB — POCT URINALYSIS DIPSTICK
Bilirubin, UA: NEGATIVE
Blood, UA: NEGATIVE
Glucose, UA: NEGATIVE
Ketones, UA: NEGATIVE
Leukocytes, UA: NEGATIVE
Nitrite, UA: NEGATIVE
Protein, UA: NEGATIVE
Spec Grav, UA: 1.025
Urobilinogen, UA: 0.2
pH, UA: 5

## 2014-07-13 LAB — CBC WITH DIFFERENTIAL/PLATELET
Basophils Absolute: 0 10*3/uL (ref 0.0–0.1)
Basophils Relative: 0.5 % (ref 0.0–3.0)
Eosinophils Absolute: 0.1 10*3/uL (ref 0.0–0.7)
Eosinophils Relative: 2.1 % (ref 0.0–5.0)
HCT: 40.2 % (ref 36.0–46.0)
Hemoglobin: 13.6 g/dL (ref 12.0–15.0)
Lymphocytes Relative: 36.3 % (ref 12.0–46.0)
Lymphs Abs: 1.9 10*3/uL (ref 0.7–4.0)
MCHC: 33.9 g/dL (ref 30.0–36.0)
MCV: 82.9 fl (ref 78.0–100.0)
Monocytes Absolute: 0.4 10*3/uL (ref 0.1–1.0)
Monocytes Relative: 7.7 % (ref 3.0–12.0)
Neutro Abs: 2.8 10*3/uL (ref 1.4–7.7)
Neutrophils Relative %: 53.4 % (ref 43.0–77.0)
Platelets: 252 10*3/uL (ref 150.0–400.0)
RBC: 4.84 Mil/uL (ref 3.87–5.11)
RDW: 12.9 % (ref 11.5–15.5)
WBC: 5.2 10*3/uL (ref 4.0–10.5)

## 2014-07-13 LAB — HEPATIC FUNCTION PANEL
ALT: 8 U/L (ref 0–35)
AST: 13 U/L (ref 0–37)
Albumin: 4 g/dL (ref 3.5–5.2)
Alkaline Phosphatase: 77 U/L (ref 39–117)
Bilirubin, Direct: 0.1 mg/dL (ref 0.0–0.3)
Total Bilirubin: 0.5 mg/dL (ref 0.2–1.2)
Total Protein: 7 g/dL (ref 6.0–8.3)

## 2014-07-13 LAB — LIPID PANEL
Cholesterol: 222 mg/dL — ABNORMAL HIGH (ref 0–200)
HDL: 56 mg/dL (ref >39.00)
LDL Cholesterol: 142 mg/dL — ABNORMAL HIGH (ref 0–99)
NonHDL: 166
Total CHOL/HDL Ratio: 4
Triglycerides: 120 mg/dL (ref 0.0–149.0)
VLDL: 24 mg/dL (ref 0.0–40.0)

## 2014-07-13 MED ORDER — OMEPRAZOLE MAGNESIUM 20 MG PO TBEC: 20.0000 mg | DELAYED_RELEASE_TABLET | Freq: Every day | ORAL | Status: DC

## 2014-07-13 MED ORDER — TRAZODONE HCL 50 MG PO TABS: 25.0000 mg | ORAL_TABLET | Freq: Every evening | ORAL | Status: DC | PRN

## 2014-07-13 NOTE — Patient Instructions (Signed)
Fat and Cholesterol Control Diet Fat and cholesterol levels in your blood and organs are influenced by your diet. High levels of fat and cholesterol may lead to diseases of the heart, small and large blood vessels, gallbladder, liver, and pancreas. CONTROLLING FAT AND CHOLESTEROL WITH DIET Although exercise and lifestyle factors are important, your diet is key. That is because certain foods are known to raise cholesterol and others to lower it. The goal is to balance foods for their effect on cholesterol and more importantly, to replace saturated and trans fat with other types of fat, such as monounsaturated fat, polyunsaturated fat, and omega-3 fatty acids. On average, a person should consume no more than 15 to 17 g of saturated fat daily. Saturated and trans fats are considered "bad" fats, and they will raise LDL cholesterol. Saturated fats are primarily found in animal products such as meats, butter, and cream. However, that does not mean you need to give up all your favorite foods. Today, there are good tasting, low-fat, low-cholesterol substitutes for most of the things you like to eat. Choose low-fat or nonfat alternatives. Choose round or loin cuts of red meat. These types of cuts are lowest in fat and cholesterol. Chicken (without the skin), fish, veal, and ground turkey breast are great choices. Eliminate fatty meats, such as hot dogs and salami. Even shellfish have little or no saturated fat. Have a 3 oz (85 g) portion when you eat lean meat, poultry, or fish. Trans fats are also called "partially hydrogenated oils." They are oils that have been scientifically manipulated so that they are solid at room temperature resulting in a longer shelf life and improved taste and texture of foods in which they are added. Trans fats are found in stick margarine, some tub margarines, cookies, crackers, and baked goods.  When baking and cooking, oils are a great substitute for butter. The monounsaturated oils are  especially beneficial since it is believed they lower LDL and raise HDL. The oils you should avoid entirely are saturated tropical oils, such as coconut and palm.  Remember to eat a lot from food groups that are naturally free of saturated and trans fat, including fish, fruit, vegetables, beans, grains (barley, rice, couscous, bulgur wheat), and pasta (without cream sauces).  IDENTIFYING FOODS THAT LOWER FAT AND CHOLESTEROL  Soluble fiber may lower your cholesterol. This type of fiber is found in fruits such as apples, vegetables such as broccoli, potatoes, and carrots, legumes such as beans, peas, and lentils, and grains such as barley. Foods fortified with plant sterols (phytosterol) may also lower cholesterol. You should eat at least 2 g per day of these foods for a cholesterol lowering effect.  Read package labels to identify low-saturated fats, trans fat free, and low-fat foods at the supermarket. Select cheeses that have only 2 to 3 g saturated fat per ounce. Use a heart-healthy tub margarine that is free of trans fats or partially hydrogenated oil. When buying baked goods (cookies, crackers), avoid partially hydrogenated oils. Breads and muffins should be made from whole grains (whole-wheat or whole oat flour, instead of "flour" or "enriched flour"). Buy non-creamy canned soups with reduced salt and no added fats.  FOOD PREPARATION TECHNIQUES  Never deep-fry. If you must fry, either stir-fry, which uses very little fat, or use non-stick cooking sprays. When possible, broil, bake, or roast meats, and steam vegetables. Instead of putting butter or margarine on vegetables, use lemon and herbs, applesauce, and cinnamon (for squash and sweet potatoes). Use nonfat   yogurt, salsa, and low-fat dressings for salads.  LOW-SATURATED FAT / LOW-FAT FOOD SUBSTITUTES Meats / Saturated Fat (g)  Avoid: Steak, marbled (3 oz/85 g) / 11 g  Choose: Steak, lean (3 oz/85 g) / 4 g  Avoid: Hamburger (3 oz/85 g) / 7  g  Choose: Hamburger, lean (3 oz/85 g) / 5 g  Avoid: Ham (3 oz/85 g) / 6 g  Choose: Ham, lean cut (3 oz/85 g) / 2.4 g  Avoid: Chicken, with skin, dark meat (3 oz/85 g) / 4 g  Choose: Chicken, skin removed, dark meat (3 oz/85 g) / 2 g  Avoid: Chicken, with skin, light meat (3 oz/85 g) / 2.5 g  Choose: Chicken, skin removed, light meat (3 oz/85 g) / 1 g Dairy / Saturated Fat (g)  Avoid: Whole milk (1 cup) / 5 g  Choose: Low-fat milk, 2% (1 cup) / 3 g  Choose: Low-fat milk, 1% (1 cup) / 1.5 g  Choose: Skim milk (1 cup) / 0.3 g  Avoid: Hard cheese (1 oz/28 g) / 6 g  Choose: Skim milk cheese (1 oz/28 g) / 2 to 3 g  Avoid: Cottage cheese, 4% fat (1 cup) / 6.5 g  Choose: Low-fat cottage cheese, 1% fat (1 cup) / 1.5 g  Avoid: Ice cream (1 cup) / 9 g  Choose: Sherbet (1 cup) / 2.5 g  Choose: Nonfat frozen yogurt (1 cup) / 0.3 g  Choose: Frozen fruit bar / trace  Avoid: Whipped cream (1 tbs) / 3.5 g  Choose: Nondairy whipped topping (1 tbs) / 1 g Condiments / Saturated Fat (g)  Avoid: Mayonnaise (1 tbs) / 2 g  Choose: Low-fat mayonnaise (1 tbs) / 1 g  Avoid: Butter (1 tbs) / 7 g  Choose: Extra light margarine (1 tbs) / 1 g  Avoid: Coconut oil (1 tbs) / 11.8 g  Choose: Olive oil (1 tbs) / 1.8 g  Choose: Corn oil (1 tbs) / 1.7 g  Choose: Safflower oil (1 tbs) / 1.2 g  Choose: Sunflower oil (1 tbs) / 1.4 g  Choose: Soybean oil (1 tbs) / 2.4 g  Choose: Canola oil (1 tbs) / 1 g Document Released: 05/05/2005 Document Revised: 08/30/2012 Document Reviewed: 08/03/2013 [employer] Patient Information 2015 [employer]. This information is not intended to replace advice given to you by your health care provider. Make sure you discuss any questions you have with your health care provider.  

## 2014-07-13 NOTE — Telephone Encounter (Signed)
Pt is waiting on trazodone [facility] [address]

## 2014-07-13 NOTE — Progress Notes (Signed)
Subjective:    Patient ID: Madison Blair, female    DOB: 02-16-1946, 69 y.o.   MRN: [id number]  HPI 69 year old  This is a routine wellness  examination for this patient . I reviewed all health maintenance protocols including mammography, colonoscopy, bone density Needed referrals were placed. Age and diagnosis  appropriate screening labs were ordered. Her immunization history was reviewed and appropriate vaccinations were ordered. Her current medications and allergies were reviewed and needed refills of her chronic medications were ordered. The plan for yearly health maintenance was discussed all orders and referrals were made as appropriate.   Review of Systems  Constitutional: Negative.   HENT: Negative.   Eyes: Negative.   Respiratory: Negative.   Cardiovascular: Negative.   Gastrointestinal: Negative.   Endocrine: Negative.   Genitourinary: Negative.   Musculoskeletal: Negative.   Skin: Negative.   Allergic/Immunologic: Negative.   Neurological: Negative.   Hematological: Negative.   Psychiatric/Behavioral: Negative.    Past Medical History  Diagnosis Date  . Hyperlipidemia   . PUD (peptic ulcer disease)     History   Social History  . Marital Status: Divorced    Spouse Name: N/A  . Number of Children: 2   . Years of Education: N/A   Occupational History  .      Retired   Social History Main Topics  . Smoking status: Former Smoker    Quit date: 05/19/1990  . Smokeless tobacco: Not on file  . Alcohol Use: Yes     Comment: Rarely  . Drug Use: Not on file  . Sexual Activity: Not on file   Other Topics Concern  . Not on file   Social History Narrative    Past Surgical History  Procedure Laterality Date  . Tonsillectomy    . Abdominal hysterectomy    . Appendectomy    . Bladder tack      Family History  Problem Relation Age of Onset  . Heart disease Father     Pacemaker  . Heart disease Mother     MVR; MI  . Breast cancer Mother     No  Known Allergies  Current Outpatient Prescriptions on File Prior to Visit  Medication Sig Dispense Refill  . aspirin 325 MG tablet Take 325 mg by mouth daily.    . Calcium Carbonate (CALTRATE 600 PO) Take 1,000 Units by mouth daily.    Marland Kitchen estradiol (ESTRACE) 1 MG tablet Take 1 mg by mouth daily.     No current facility-administered medications on file prior to visit.    BP 120/72 mmHg  Pulse 92  Temp(Src) 98 F (36.7 C) (Oral)  Ht 5' 5.5" (1.664 m)  Wt 130 lb 3.2 oz (59.058 kg)  BMI 21.33 kg/m2chart    Objective:   Physical Exam  Constitutional: She is oriented to person, place, and time. She appears well-developed and well-nourished.  HENT:  Head: Normocephalic.  Right Ear: External ear normal.  Left Ear: External ear normal.  Nose: Nose normal.  Mouth/Throat: Oropharynx is clear and moist.  Eyes: Conjunctivae and EOM are normal. Pupils are equal, round, and reactive to light.  Neck: Normal range of motion. Neck supple. No thyromegaly present.  Cardiovascular: Normal rate, regular rhythm and normal heart sounds.   Pulmonary/Chest: Effort normal and breath sounds normal.  Abdominal: Soft. Bowel sounds are normal.  Musculoskeletal: Normal range of motion.  Neurological: She is alert and oriented to person, place, and time. She has normal reflexes. She  displays normal reflexes. No cranial nerve deficit. Coordination normal.  Skin: Skin is warm and dry.  Psychiatric: She has a normal mood and affect.          Assessment & Plan:  Madison Blair was seen today for follow-up.  Diagnoses and all orders for this visit:  Medicare annual wellness visit, subsequent Orders: -     POCT urinalysis dipstick  Gastroesophageal reflux disease without esophagitis Orders: -     CBC with Differential -     POCT urinalysis dipstick  Pure hypercholesterolemia Orders: -     Lipid Panel -     Hepatic Function Panel -     CBC with Differential -     POCT urinalysis dipstick  Other  orders -     omeprazole (PRILOSEC OTC) 20 MG tablet; Take 1 tablet (20 mg total) by mouth daily.   Call the office with any question or concerns. Exercise to stay healthy. See GYN as scheduled. Mammogram as scheduled.

## 2014-07-13 NOTE — Progress Notes (Signed)
Pre visit review using our clinic review tool, if applicable. No additional management support is needed unless otherwise documented below in the visit note. 

## 2014-08-08 ENCOUNTER — Other Ambulatory Visit: Payer: Self-pay | Admitting: Obstetrics & Gynecology

## 2014-08-08 DIAGNOSIS — E2839 Other primary ovarian failure: Secondary | ICD-10-CM

## 2014-08-14 ENCOUNTER — Other Ambulatory Visit: Payer: Self-pay | Admitting: Obstetrics & Gynecology

## 2014-08-14 DIAGNOSIS — R928 Other abnormal and inconclusive findings on diagnostic imaging of breast: Secondary | ICD-10-CM

## 2014-08-15 ENCOUNTER — Ambulatory Visit
Admission: RE | Admit: 2014-08-15 | Discharge: 2014-08-15 | Disposition: A | Discharge status: Home / Self Care | Payer: Medicare HMO | Source: Ambulatory Visit | Attending: Obstetrics & Gynecology | Admitting: Obstetrics & Gynecology

## 2014-08-15 DIAGNOSIS — R928 Other abnormal and inconclusive findings on diagnostic imaging of breast: Secondary | ICD-10-CM

## 2014-08-15 DIAGNOSIS — E2839 Other primary ovarian failure: Secondary | ICD-10-CM

## 2014-10-03 ENCOUNTER — Ambulatory Visit
Admission: RE | Admit: 2014-10-03 | Discharge: 2014-10-03 | Disposition: A | Discharge status: Home / Self Care | Payer: Medicare HMO | Source: Ambulatory Visit | Attending: Obstetrics & Gynecology | Admitting: Obstetrics & Gynecology

## 2014-10-03 ENCOUNTER — Other Ambulatory Visit: Payer: Self-pay | Admitting: Obstetrics & Gynecology

## 2014-10-03 DIAGNOSIS — N644 Mastodynia: Secondary | ICD-10-CM

## 2015-06-11 ENCOUNTER — Encounter: Payer: Self-pay | Admitting: Family Medicine

## 2015-06-11 ENCOUNTER — Ambulatory Visit (INDEPENDENT_AMBULATORY_CARE_PROVIDER_SITE_OTHER): Payer: PPO | Admitting: Family Medicine

## 2015-06-11 VITALS — BP 100/80 | HR 70 | Temp 98.1°F | Wt 125.0 lb

## 2015-06-11 DIAGNOSIS — I341 Nonrheumatic mitral (valve) prolapse: Secondary | ICD-10-CM | POA: Insufficient documentation

## 2015-06-11 DIAGNOSIS — E785 Hyperlipidemia, unspecified: Secondary | ICD-10-CM | POA: Diagnosis not present

## 2015-06-11 DIAGNOSIS — Z87891 Personal history of nicotine dependence: Secondary | ICD-10-CM | POA: Diagnosis not present

## 2015-06-11 DIAGNOSIS — Z1211 Encounter for screening for malignant neoplasm of colon: Secondary | ICD-10-CM

## 2015-06-11 DIAGNOSIS — B182 Chronic viral hepatitis C: Secondary | ICD-10-CM | POA: Diagnosis not present

## 2015-06-11 DIAGNOSIS — K219 Gastro-esophageal reflux disease without esophagitis: Secondary | ICD-10-CM

## 2015-06-11 DIAGNOSIS — M1990 Unspecified osteoarthritis, unspecified site: Secondary | ICD-10-CM

## 2015-06-11 DIAGNOSIS — G4700 Insomnia, unspecified: Secondary | ICD-10-CM | POA: Insufficient documentation

## 2015-06-11 DIAGNOSIS — M19049 Primary osteoarthritis, unspecified hand: Secondary | ICD-10-CM | POA: Insufficient documentation

## 2015-06-11 DIAGNOSIS — R634 Abnormal weight loss: Secondary | ICD-10-CM | POA: Insufficient documentation
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
## 2015-06-11 NOTE — Progress Notes (Signed)
Madison Reddish, MD Phone: [phone]  Subjective:  Patient presents today to establish care with me as their new primary care provider. Patient was formerly a patient of Dr. Megan Salon. 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  Chief complaint-noted.   See problem oriented charting ROS- No chest pain or shortness of breath. No headache or blurry vision.   The following were reviewed and entered/updated in epic: Past Medical History  Diagnosis Date  . Hyperlipidemia   . PUD (peptic ulcer disease)     x6   Patient Active Problem List   Diagnosis Date Noted  . Loss of weight 06/11/2015    Priority: Medium  . Hepatitis C, chronic ([place]) 06/29/2013    Priority: Medium  . Hyperlipidemia 08/01/2011    Priority: Medium  . Insomnia 06/11/2015    Priority: Low  . Mitral valve prolapse 06/11/2015    Priority: Low  . 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 Former smoker 06/11/2015    Priority: Low  . GERD (gastroesophageal reflux disease) 06/29/2013    Priority: Low  . Chest pain 08/01/2011    Priority: Low   Past Surgical History  Procedure Laterality Date  . Tonsillectomy    . Abdominal hysterectomy      TAAH, BSO- removed cervix. bladder tack that failed  . Appendectomy    . Bladder tack      failed    Family History  Problem Relation Age of Onset  . Heart disease Father     Pacemaker  . Heart disease Mother     MVR; MI 35  . Breast cancer Mother     in 25s    Medications- reviewed and updated Current Outpatient Prescriptions  Medication Sig Dispense Refill  . aspirin 325 MG tablet Take 325 mg by mouth daily.    Marland Kitchen estradiol (ESTRACE) 1 MG tablet Take 1 mg by mouth daily.    Marland Kitchen omeprazole (PRILOSEC OTC) 20 MG tablet Take 1 tablet (20 mg total) by mouth daily. 90 tablet 1   Allergies-reviewed and updated No Known Allergies  Social History   Social History  . Marital Status: Divorced    Spouse Name: N/A  .  Number of Children: 2   . Years of Education: N/A   Occupational History  .      Retired   Social History Main Topics  . Smoking status: Former Smoker -- 1.00 packs/day for 30 years    Types: Cigarettes    Quit date: 05/19/1990  . Smokeless tobacco: None  . Alcohol Use: 0.0 oz/week    0 Standard drinks or equivalent per week     Comment: Rarely  . Drug Use: No  . Sexual Activity: Not Asked   Other Topics Concern  . None   Social History Narrative   Divorced. 2 children (daughter 62, son 67 in 2017). 2 grandchildren from daughter [id number] month old [name], mckenzie 1 month in 2017).       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, traveling internationally- favorite place [place], reading    ROS--See HPI   Objective: BP 100/80 mmHg  Pulse 70  Temp(Src) 98.1 F (36.7 C)  Wt 125 lb (56.7 kg) Gen: NAD, resting comfortably HEENT: Mucous membranes are moist. Oropharynx normal Neck: no thyromegaly CV: RRR no murmurs rubs or gallops Lungs: CTAB no crackles,  wheeze, rhonchi Abdomen: soft/nontender/nondistended/normal bowel sounds. No rebound or guarding.  Ext: no edema Skin: warm, dry Neuro: grossly normal, moves all extremities, PERRLA  Assessment/Plan:  Hepatitis C, chronic (HCC) S: 34 years ago had a needle stick. History of hepatitis "non A, non B". No recent bloodwork.  A/P: we will update full hepatitis panel as has not had in recent memory. Immunize as able if prior exposure.   Hyperlipidemia S: Asa 325 daily. 4.5% 10 year risk using 2016 #s as of 06/11/15. Mom MI age 48- massive, valve replacement at 87.  A/P: continue without statin given risk <7.5%- given overall health- consider 10% cutoff   Loss of weight S:2 years last April, [place] hot water heater burst and had flooding issues. Several months to get it cleared out. At that time she weighed 141 lbs, now down to 125 lbs. Hard to cook for 1 person.  Cut down on soft drinks. Down 16 lbs in 2 years.  A/P: up to date on mammography, cervical cancer screening. We discussed considering CXR or CT chest- she declines at this time but will monitor weight. May be due to just snacking instead of full meals as well as cutting out sodas. Will send for Colonoscopy with Dr. Starr Sinclair.    CMC arthritis S: issue for several years- avoids nsaids. Pain over [place] joint. Asks about carpal tunnel but no other joints affected. Sometimes it is very severe other times minimal issue A/P: discussed injections if becomes bothersome- she is not ready at present   GERD (gastroesophageal reflux disease) S: Omeprazole 20mg . x6 PUD.  A/P: due to recurrent PUD issues- will continue on PPI chronically.    Return in about 1 year (around 06/10/2016). awv Send labs to Dr. Stann Mainland Return precautions advised.   Orders Placed This Encounter  Procedures  . CBC with Differential/Platelet    Standing Status: Future     Number of Occurrences:      Standing Expiration Date: 06/10/2016  . Comprehensive metabolic panel    [place]    Standing Status: Future     Number of Occurrences:      Standing Expiration Date: 06/10/2016    Order Specific Question:  Has the patient fasted?    Answer:  No  . TSH    [place]    Standing Status: Future     Number of Occurrences:      Standing Expiration Date: 06/10/2016  . Lipid panel    [place]    Standing Status: Future     Number of Occurrences:      Standing Expiration Date: 06/10/2016    Order Specific Question:  Has the patient fasted?    Answer:  No  . Hepatitis C antibody, reflex    Standing Status: Future     Number of Occurrences:      Standing Expiration Date: 06/10/2016  . Hepatitis B surface antigen    Standing Status: Future     Number of Occurrences:      Standing Expiration Date: 06/10/2016  . Hepatitis B surface antibody    Standing Status: Future     Number of Occurrences:      Standing Expiration Date: 06/10/2016   . Hepatitis B core antibody, total    Standing Status: Future     Number of Occurrences:      Standing Expiration Date: 06/10/2016  . Hepatitis A antibody, total    Standing Status: Future     Number of Occurrences:  Standing Expiration Date: 06/10/2016  . Hepatitis A antibody, IgM    Standing Status: Future     Number of Occurrences:      Standing Expiration Date: 06/10/2016  . Ambulatory referral to Gastroenterology    Referral Priority:  Routine    Referral Type:  Consultation    Referral Reason:  Specialty Services Required    Number of Visits Requested:  1  . POCT urinalysis dipstick    In house    Standing Status: Future     Number of Occurrences:      Standing Expiration Date: 06/10/2016

## 2015-06-11 NOTE — Assessment & Plan Note (Signed)
S: Omeprazole 20mg . x6 PUD.  A/P: due to recurrent PUD issues- will continue on PPI chronically.

## 2015-06-11 NOTE — Assessment & Plan Note (Signed)
S:2 years last April, [place] hot water heater burst and had flooding issues. Several months to get it cleared out. At that time she weighed 141 lbs, now down to 125 lbs. Hard to cook for 1 person. Cut down on soft drinks. Down 16 lbs in 2 years.  A/P: up to date on mammography, cervical cancer screening. We discussed considering CXR or CT chest- she declines at this time but will monitor weight. May be due to just snacking instead of full meals as well as cutting out sodas. Will send for Colonoscopy with Dr. Starr Sinclair.

## 2015-06-11 NOTE — Assessment & Plan Note (Signed)
S: issue for several years- avoids nsaids. Pain over [place] joint. Asks about carpal tunnel but no other joints affected. Sometimes it is very severe other times minimal issue A/P: discussed injections if becomes bothersome- she is not ready at present

## 2015-06-11 NOTE — Patient Instructions (Addendum)
[facility] will call you and schedule your colonoscopy.  Great to meet you!    Return for labs fasting on 07/15/15 or later. Please cancel your visit with me on the 28th. Let's see each other in a year and a day from your labs.   I do want you to keep an eye on your weight- if you continue to lose weight- please let me know sooner than a year  Your left hand seems to be plagued by some CMC joint arthritis. We could get you in with sports medicine or ortho for injection if becomes really bothersome

## 2015-06-11 NOTE — Assessment & Plan Note (Signed)
S: Asa 325 daily. 4.5% 10 year risk using 2016 #s as of 06/11/15. Mom MI age 68- massive, valve replacement at 9.  A/P: continue without statin given risk <7.5%- given overall health- consider 10% cutoff

## 2015-07-17 ENCOUNTER — Other Ambulatory Visit (INDEPENDENT_AMBULATORY_CARE_PROVIDER_SITE_OTHER): Payer: PPO

## 2015-07-17 ENCOUNTER — Encounter: Payer: Medicare HMO | Admitting: Family Medicine

## 2015-07-17 DIAGNOSIS — E785 Hyperlipidemia, unspecified: Secondary | ICD-10-CM | POA: Diagnosis not present

## 2015-07-17 DIAGNOSIS — Z87891 Personal history of nicotine dependence: Secondary | ICD-10-CM | POA: Diagnosis not present

## 2015-07-17 DIAGNOSIS — B182 Chronic viral hepatitis C: Secondary | ICD-10-CM

## 2015-07-17 LAB — LIPID PANEL
Cholesterol: 221 mg/dL — ABNORMAL HIGH (ref 0–200)
HDL: 58.7 mg/dL (ref >39.00)
LDL Cholesterol: 130 mg/dL — ABNORMAL HIGH (ref 0–99)
NonHDL: 162.73
Total CHOL/HDL Ratio: 4
Triglycerides: 165 mg/dL — ABNORMAL HIGH (ref 0.0–149.0)
VLDL: 33 mg/dL (ref 0.0–40.0)

## 2015-07-17 LAB — CBC WITH DIFFERENTIAL/PLATELET
Basophils Absolute: 0 10*3/uL (ref 0.0–0.1)
Basophils Relative: 0.6 % (ref 0.0–3.0)
Eosinophils Absolute: 0.2 10*3/uL (ref 0.0–0.7)
Eosinophils Relative: 3.5 % (ref 0.0–5.0)
HCT: 38 % (ref 36.0–46.0)
Hemoglobin: 12.8 g/dL (ref 12.0–15.0)
Lymphocytes Relative: 40.4 % (ref 12.0–46.0)
Lymphs Abs: 2.1 10*3/uL (ref 0.7–4.0)
MCHC: 33.7 g/dL (ref 30.0–36.0)
MCV: 83.7 fl (ref 78.0–100.0)
Monocytes Absolute: 0.4 10*3/uL (ref 0.1–1.0)
Monocytes Relative: 8.4 % (ref 3.0–12.0)
Neutro Abs: 2.4 10*3/uL (ref 1.4–7.7)
Neutrophils Relative %: 47.1 % (ref 43.0–77.0)
Platelets: 281 10*3/uL (ref 150.0–400.0)
RBC: 4.54 Mil/uL (ref 3.87–5.11)
RDW: 13.8 % (ref 11.5–15.5)
WBC: 5.2 10*3/uL (ref 4.0–10.5)

## 2015-07-17 LAB — COMPREHENSIVE METABOLIC PANEL
ALT: 10 U/L (ref 0–35)
AST: 12 U/L (ref 0–37)
Albumin: 3.9 g/dL (ref 3.5–5.2)
Alkaline Phosphatase: 66 U/L (ref 39–117)
BUN: 13 mg/dL (ref 6–23)
CO2: 30 mEq/L (ref 19–32)
Calcium: 9.5 mg/dL (ref 8.4–10.5)
Chloride: 108 mEq/L (ref 96–112)
Creatinine, Ser: 0.7 mg/dL (ref 0.40–1.20)
GFR: 88.64 mL/min (ref >60.00)
Glucose, Bld: 88 mg/dL (ref 70–99)
Potassium: 4.1 mEq/L (ref 3.5–5.1)
Sodium: 146 mEq/L — ABNORMAL HIGH (ref 135–145)
Total Bilirubin: 0.4 mg/dL (ref 0.2–1.2)
Total Protein: 7.1 g/dL (ref 6.0–8.3)

## 2015-07-17 LAB — POCT URINALYSIS DIPSTICK
Bilirubin, UA: NEGATIVE
Blood, UA: NEGATIVE
Glucose, UA: NEGATIVE
Ketones, UA: NEGATIVE
Leukocytes, UA: NEGATIVE
Nitrite, UA: NEGATIVE
Protein, UA: NEGATIVE
Spec Grav, UA: 1.02
Urobilinogen, UA: 0.2
pH, UA: 5.5

## 2015-07-17 LAB — TSH: TSH: 2.65 u[IU]/mL (ref 0.35–4.50)

## 2015-07-18 LAB — HEPATITIS C ANTIBODY: HCV Ab: REACTIVE — AB

## 2015-07-18 LAB — HEPATITIS B SURFACE ANTIBODY,QUALITATIVE: Hep B S Ab: POSITIVE — AB

## 2015-07-18 LAB — HEPATITIS B SURFACE ANTIGEN: Hepatitis B Surface Ag: NEGATIVE

## 2015-07-18 LAB — HEPATITIS A ANTIBODY, TOTAL: Hep A Total Ab: REACTIVE — AB

## 2015-07-18 LAB — HEPATITIS B CORE ANTIBODY, TOTAL: Hep B Core Total Ab: NONREACTIVE

## 2015-07-18 LAB — HEPATITIS A ANTIBODY, IGM: Hep A IgM: NONREACTIVE

## 2015-07-19 LAB — HEPATITIS C RNA QUANTITATIVE: HCV Quantitative: NOT DETECTED IU/mL (ref <15)

## 2015-08-27 DIAGNOSIS — Z6821 Body mass index (BMI) 21.0-21.9, adult: Secondary | ICD-10-CM | POA: Diagnosis not present

## 2015-08-27 DIAGNOSIS — Z01419 Encounter for gynecological examination (general) (routine) without abnormal findings: Secondary | ICD-10-CM | POA: Diagnosis not present

## 2015-08-27 DIAGNOSIS — Z1231 Encounter for screening mammogram for malignant neoplasm of breast: Secondary | ICD-10-CM | POA: Diagnosis not present

## 2015-08-27 LAB — HM MAMMOGRAPHY: HM Mammogram: NORMAL (ref 0–4)

## 2015-08-28 ENCOUNTER — Encounter: Payer: Self-pay | Admitting: Family Medicine

## 2015-12-07 DIAGNOSIS — D2262 Melanocytic nevi of left upper limb, including shoulder: Secondary | ICD-10-CM | POA: Diagnosis not present

## 2015-12-07 DIAGNOSIS — L814 Other melanin hyperpigmentation: Secondary | ICD-10-CM | POA: Diagnosis not present

## 2015-12-07 DIAGNOSIS — D225 Melanocytic nevi of trunk: Secondary | ICD-10-CM | POA: Diagnosis not present

## 2015-12-07 DIAGNOSIS — D1801 Hemangioma of skin and subcutaneous tissue: Secondary | ICD-10-CM | POA: Diagnosis not present

## 2015-12-07 DIAGNOSIS — Z85828 Personal history of other malignant neoplasm of skin: Secondary | ICD-10-CM | POA: Diagnosis not present

## 2015-12-07 DIAGNOSIS — L821 Other seborrheic keratosis: Secondary | ICD-10-CM | POA: Diagnosis not present

## 2015-12-31 ENCOUNTER — Ambulatory Visit (INDEPENDENT_AMBULATORY_CARE_PROVIDER_SITE_OTHER): Payer: PPO | Admitting: Family Medicine

## 2015-12-31 ENCOUNTER — Encounter: Payer: Self-pay | Admitting: Family Medicine

## 2015-12-31 VITALS — BP 120/80 | HR 69 | Temp 98.2°F | Wt 130.0 lb

## 2015-12-31 DIAGNOSIS — R009 Unspecified abnormalities of heart beat: Secondary | ICD-10-CM

## 2015-12-31 DIAGNOSIS — R001 Bradycardia, unspecified: Secondary | ICD-10-CM

## 2015-12-31 DIAGNOSIS — R5383 Other fatigue: Secondary | ICD-10-CM

## 2015-12-31 NOTE — Progress Notes (Signed)
Pre visit review using our clinic review tool, if applicable. No additional management support is needed unless otherwise documented below in the visit note. 

## 2015-12-31 NOTE — Patient Instructions (Signed)
We will call you within a week about your referral to cardiology. If you do not hear within 2 weeks, give [place] a call.   If you have worsening symptoms- seek care. I would say ok to exercise as long as no symptoms- hold off on elliptical until cards sees you.   Labs before you leave

## 2015-12-31 NOTE — Progress Notes (Signed)
Subjective:  SOMONE MURAT is a 70 y.o. year old very pleasant female patient who presents for/with See problem oriented charting ROS- see any ROS included in HPI as well.   Past Medical History-  Patient Active Problem List   Diagnosis Date Noted  . Loss of weight 06/11/2015    Priority: Medium  . Hepatitis C, chronic ([place]) 06/29/2013    Priority: Medium  . Hyperlipidemia 08/01/2011    Priority: Medium  . Insomnia 06/11/2015    Priority: Low  . Mitral valve prolapse 06/11/2015    Priority: Low  . 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 Former smoker 06/11/2015    Priority: Low  . GERD (gastroesophageal reflux disease) 06/29/2013    Priority: Low  . Chest pain 08/01/2011    Priority: Low    Medications- reviewed and updated Current Outpatient Prescriptions  Medication Sig Dispense Refill  . aspirin 325 MG tablet Take 325 mg by mouth daily.    Marland Kitchen estradiol (ESTRACE) 1 MG tablet Take 1 mg by mouth daily.    Marland Kitchen lactobacillus acidophilus (BACID) TABS tablet Take 1 tablet by mouth daily.    Marland Kitchen omeprazole (PRILOSEC OTC) 20 MG tablet Take 1 tablet (20 mg total) by mouth daily. 90 tablet 1   No current facility-administered medications for this visit.     Objective: BP 120/80 (BP Location: Left Arm, Patient Position: Sitting, Cuff Size: Normal)   Pulse 69   Temp 98.2 F (36.8 C) (Oral)   Wt 130 lb (59 kg)   SpO2 96%   BMI 21.30 kg/m  Gen: NAD, resting comfortably CV: RRR with occasional ectopic beat no murmurs rubs or gallops Lungs: CTAB no crackles, wheeze, rhonchi Ext: no edema Skin: warm, dry, no rash Neuro: grossly normal, moves all extremities, normal gait  EKG: rate of 62, sinus rhythm with 1 PVC noted, normal axis, normal intervals, no hypertrophy- p wave duration in II <0.12 so do not see left atrial enlargement.   Assessment/Plan:   Fatigue/sluggishness/bradycardia S: Has been exercising 3 days a week at [facility]- upper body and riding stationary bike for  5 miles. Got on elliptical and lasted only 1 minute before feeling short of breath- slowed down the next day and tried 2 minutes and felt very sluggish, mildly short of breath. Today went slower and was able to do over 3 minutes without issues. No issues on the bike doing 5 miles.   Overall has felt sluggish over the last few days as well. Resting a few nights felt sluggish and checked pulse and in high 40s resting- checked at [facility] and 48 before exercise. after exercise it was 70. On elliptical it was in the 90s. Another day before starting Stationary bike did note pulse in  high 40s and then slowly went up to the 70s. When she checks her pulse herself- sometimes she feels a missed or extra beat but does not feel palpitations in chest.   Dad had pacemaker for bradycardia as did mother (mother also had valve replacement). Mother died of MI at 3.  No palpitations but feels irregular heart beat at times when she checks her pulse.   Ros- denies any exertional chest pain, no shortness of breath unless on the elliptical which she has not done in the past. No left arm or neck pain. No abnormal diaphoresis or dizziness.  A/P: Patient is very concerned due to cardiac pacemaker in both parents of bradycardia and has noted her own heart rate in  high 40s resting on several occasions. Other than feeling fatigued/sluggish she does not have any other symptoms and is still able to exercise other than on elliptical. I told her she could exercise but gave precautions and is to hold off on elliptical until she sees cardiology. I have referred her today to cardiology for bradycardia. With regards to EKG- did have PVC and on auscultation there is an occasional ectopic beat- suspect this is what she is feeling when checking her own pulse. Also check some basic labs today including thyroid  Orders Placed This Encounter  Procedures  . CBC    [place]  . Comprehensive metabolic panel    [place]  . TSH    [place]  .  Ambulatory referral to Cardiology    Referral Priority:   Routine    Referral Type:   Consultation    Referral Reason:   Specialty Services Required    Requested Specialty:   Cardiology    Number of Visits Requested:   1  . EKG 12-Lead    Order Specific Question:   Where should this test be performed    Answer:   Other    Meds ordered this encounter  Medications  . lactobacillus acidophilus (BACID) TABS tablet    Sig: Take 1 tablet by mouth daily.   The duration of face-to-face time during this visit was 25 minutes. Greater than 50% of this time was spent in counseling, explanation of diagnosis, planning of further management, and/or coordination of care.   Return precautions advised specifically reasons to go to ED in regards to bradycardia Garret Reddish, MD

## 2016-01-01 ENCOUNTER — Encounter: Payer: Self-pay | Admitting: Family Medicine

## 2016-01-01 LAB — CBC
HCT: 37.8 % (ref 36.0–46.0)
Hemoglobin: 12.9 g/dL (ref 12.0–15.0)
MCHC: 34.3 g/dL (ref 30.0–36.0)
MCV: 83 fl (ref 78.0–100.0)
Platelets: 250 10*3/uL (ref 150.0–400.0)
RBC: 4.56 Mil/uL (ref 3.87–5.11)
RDW: 13.4 % (ref 11.5–15.5)
WBC: 6.6 10*3/uL (ref 4.0–10.5)

## 2016-01-01 LAB — COMPREHENSIVE METABOLIC PANEL
ALT: 8 U/L (ref 0–35)
AST: 11 U/L (ref 0–37)
Albumin: 3.9 g/dL (ref 3.5–5.2)
Alkaline Phosphatase: 67 U/L (ref 39–117)
BUN: 15 mg/dL (ref 6–23)
CO2: 25 mEq/L (ref 19–32)
Calcium: 9.2 mg/dL (ref 8.4–10.5)
Chloride: 106 mEq/L (ref 96–112)
Creatinine, Ser: 0.7 mg/dL (ref 0.40–1.20)
GFR: 88.52 mL/min (ref >60.00)
Glucose, Bld: 83 mg/dL (ref 70–99)
Potassium: 4.4 mEq/L (ref 3.5–5.1)
Sodium: 139 mEq/L (ref 135–145)
Total Bilirubin: 0.3 mg/dL (ref 0.2–1.2)
Total Protein: 6.7 g/dL (ref 6.0–8.3)

## 2016-01-01 LAB — TSH: TSH: 2.64 u[IU]/mL (ref 0.35–4.50)

## 2016-01-04 ENCOUNTER — Telehealth: Payer: Self-pay | Admitting: Cardiovascular Disease

## 2016-01-04 ENCOUNTER — Encounter: Payer: Self-pay | Admitting: Cardiovascular Disease

## 2016-01-14 NOTE — Telephone Encounter (Signed)
Closed encounter °

## 2016-01-16 ENCOUNTER — Ambulatory Visit (INDEPENDENT_AMBULATORY_CARE_PROVIDER_SITE_OTHER): Payer: PPO | Admitting: Cardiovascular Disease

## 2016-01-16 ENCOUNTER — Encounter: Payer: Self-pay | Admitting: Cardiovascular Disease

## 2016-01-16 DIAGNOSIS — E785 Hyperlipidemia, unspecified: Secondary | ICD-10-CM

## 2016-01-16 DIAGNOSIS — I493 Ventricular premature depolarization: Secondary | ICD-10-CM

## 2016-01-16 NOTE — Assessment & Plan Note (Signed)
Madison Blair had a lipid profile performed recently 07/09/15 revealing total cholesterol 221, LDL 1:30 and HDL 58. This is being followed by her primary care physician with diet alone at this time.

## 2016-01-16 NOTE — Assessment & Plan Note (Signed)
Madison Blair was referred by Dr. Yong Channel for dyspnea, fatigue and slow heart rate. This was noted when she was on the elliptical and was doing extensive exercise. She was somewhat dysmetric. In the course of her daily activities she denies symptoms. When she checked her heart rate was slow although I suspect this was because she was not palpating PVCs. A routine 12-lead EKG performed by her primary care physician she had sinus rhythm in the low 60s with one PVC noted. I suspect that her palpated bradycardia was related to PVCs related to her excessive exercise. I'm not inclined to perform any further testing at this time otherwise she is asymptomatic although her father did require a pacemaker for bradycardia.

## 2016-01-16 NOTE — Progress Notes (Signed)
01/16/2016 YUETTE TEALL   1945-11-04  RN:[id number]  Primary Physician Garret Reddish, MD Primary Cardiologist: Lorretta Harp MD Renae Gloss  HPI:  Madison Blair is a delightful 70 year old divorced Caucasian female mother of 2 children, grandmother of 2 grandchildren referred to Dr. Yong Channel, her primary care physician, for evaluation of dyspnea and bradycardia. She is retired Midwife at [employer]. Risk factors are mild hyperlipidemia but otherwise she is a healthy woman without significant comorbidities. She does exercise 3 days a week at the [facility]. Her father did have a permanent transvenous pacemaker for bradycardia. She denies chest pain or shortness of breath although recently while on the elliptical she was somewhat dysmetric and noted herself that she was bradycardic. An EKG performed by Dr. Yong Channel showed sinus rhythm at 60 with one PVC. I suspect her bradycardia was non palpated PVC.   Current Outpatient Prescriptions  Medication Sig Dispense Refill  . aspirin 325 MG tablet Take 325 mg by mouth daily.    Marland Kitchen estradiol (ESTRACE) 1 MG tablet Take 1 mg by mouth daily.    Marland Kitchen lactobacillus acidophilus (BACID) TABS tablet Take 1 tablet by mouth daily.    Marland Kitchen omeprazole (PRILOSEC OTC) 20 MG tablet Take 1 tablet (20 mg total) by mouth daily. 90 tablet 1   No current facility-administered medications for this visit.     No Known Allergies  Social History   Social History  . Marital status: Divorced    Spouse name: N/A  . Number of children: 2   . Years of education: N/A   Occupational History  .      Retired   Social History Main Topics  . Smoking status: Former Smoker    Packs/day: 1.00    Years: 30.00    Types: Cigarettes    Quit date: 05/19/1990  . Smokeless tobacco: Not on file  . Alcohol use 0.0 oz/week     Comment: Rarely  . Drug use: No  . Sexual activity: Not on file   Other Topics Concern  . Not on file   Social History Narrative     Divorced. 2 children (daughter 57, son 48 in 2017). 2 grandchildren from daughter 4972 month old [name], mckenzie 1 month in 2017).       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  Cardiovascular: negative for chest pain, dyspnea on exertion, edema, orthopnea, palpitations, paroxysmal nocturnal dyspnea or shortness of breath Dermatological: negative for rash Respiratory: negative for cough or wheezing Urologic: negative for hematuria Abdominal: negative for nausea, vomiting, diarrhea, bright red blood per rectum, melena, or hematemesis Neurologic: negative for visual changes, syncope, or dizziness All other systems reviewed and are otherwise negative except as noted above.    Blood pressure 130/76, pulse 72, height 5\' 6"  (1.676 m), weight 132 lb 9.6 oz (60.1 kg).  General appearance: alert and no distress Neck: no adenopathy, no carotid bruit, no JVD, supple, symmetrical, trachea midline and thyroid not enlarged, symmetric, no tenderness/mass/nodules Lungs: clear to auscultation bilaterally Heart: regular rate and rhythm, S1, S2 normal, no murmur, click, rub or gallop Extremities: extremities normal, atraumatic, no cyanosis or edema  EKG not performed today  ASSESSMENT AND PLAN:   Hyperlipidemia Mrs. Lotze had a lipid profile performed  recently 07/09/15 revealing total cholesterol 221, LDL 1:30 and HDL 58. This is being followed by her primary care physician with diet alone at this time.  PVC's (premature ventricular contractions) Mrs. Borstad was referred by Dr. Yong Channel for dyspnea, fatigue and slow heart rate. This was noted when she was on the elliptical and was doing extensive exercise. She was somewhat dysmetric. In the course of her daily activities she denies  symptoms. When she checked her heart rate was slow although I suspect this was because she was not palpating PVCs. A routine 12-lead EKG performed by her primary care physician she had sinus rhythm in the low 60s with one PVC noted. I suspect that her palpated bradycardia was related to PVCs related to her excessive exercise. I'm not inclined to perform any further testing at this time otherwise she is asymptomatic although her father did require a pacemaker for bradycardia.      Lorretta Harp MD FACP,FACC,FAHA, [facility] 01/16/2016 9:19 AM

## 2016-01-16 NOTE — Patient Instructions (Signed)
Medication Instructions:  Your physician recommends that you continue on your current medications as directed. Please refer to the Current Medication list given to you today.   Follow-Up: Your physician recommends that you schedule a follow-up appointment ON AN AS NEEDED BASIS.  If you need a refill on your cardiac medications before your next appointment, please call your pharmacy.   

## 2016-02-18 DIAGNOSIS — H04123 Dry eye syndrome of bilateral lacrimal glands: Secondary | ICD-10-CM | POA: Diagnosis not present

## 2016-02-18 DIAGNOSIS — H524 Presbyopia: Secondary | ICD-10-CM | POA: Diagnosis not present

## 2016-02-18 DIAGNOSIS — H52223 Regular astigmatism, bilateral: Secondary | ICD-10-CM | POA: Diagnosis not present

## 2016-02-18 DIAGNOSIS — H2513 Age-related nuclear cataract, bilateral: Secondary | ICD-10-CM | POA: Diagnosis not present

## 2016-03-05 DIAGNOSIS — H52223 Regular astigmatism, bilateral: Secondary | ICD-10-CM | POA: Diagnosis not present

## 2016-03-05 DIAGNOSIS — H5211 Myopia, right eye: Secondary | ICD-10-CM | POA: Diagnosis not present

## 2016-03-05 DIAGNOSIS — H5202 Hypermetropia, left eye: Secondary | ICD-10-CM | POA: Diagnosis not present

## 2016-03-05 DIAGNOSIS — H16223 Keratoconjunctivitis sicca, not specified as Sjogren's, bilateral: Secondary | ICD-10-CM | POA: Diagnosis not present

## 2016-03-05 DIAGNOSIS — H524 Presbyopia: Secondary | ICD-10-CM | POA: Diagnosis not present

## 2016-03-05 DIAGNOSIS — H04123 Dry eye syndrome of bilateral lacrimal glands: Secondary | ICD-10-CM | POA: Diagnosis not present

## 2016-05-04 ENCOUNTER — Emergency Department (HOSPITAL_COMMUNITY)
Admission: EM | Admit: 2016-05-04 | Discharge: 2016-05-04 | Disposition: A | Discharge status: Home / Self Care | Payer: PPO | Attending: Emergency Medicine | Admitting: Emergency Medicine

## 2016-05-04 ENCOUNTER — Encounter (HOSPITAL_COMMUNITY): Payer: Self-pay | Admitting: Emergency Medicine

## 2016-05-04 ENCOUNTER — Emergency Department (HOSPITAL_COMMUNITY): Payer: PPO

## 2016-05-04 DIAGNOSIS — T148XXA Other injury of unspecified body region, initial encounter: Secondary | ICD-10-CM | POA: Diagnosis not present

## 2016-05-04 DIAGNOSIS — Y999 Unspecified external cause status: Secondary | ICD-10-CM | POA: Diagnosis not present

## 2016-05-04 DIAGNOSIS — Z79899 Other long term (current) drug therapy: Secondary | ICD-10-CM | POA: Insufficient documentation

## 2016-05-04 DIAGNOSIS — Z87891 Personal history of nicotine dependence: Secondary | ICD-10-CM | POA: Insufficient documentation

## 2016-05-04 DIAGNOSIS — Y939 Activity, unspecified: Secondary | ICD-10-CM | POA: Diagnosis not present

## 2016-05-04 DIAGNOSIS — Z7982 Long term (current) use of aspirin: Secondary | ICD-10-CM | POA: Insufficient documentation

## 2016-05-04 DIAGNOSIS — S8992XA Unspecified injury of left lower leg, initial encounter: Secondary | ICD-10-CM | POA: Diagnosis not present

## 2016-05-04 DIAGNOSIS — M25562 Pain in left knee: Secondary | ICD-10-CM

## 2016-05-04 DIAGNOSIS — Y9289 Other specified places as the place of occurrence of the external cause: Secondary | ICD-10-CM | POA: Diagnosis not present

## 2016-05-04 DIAGNOSIS — W010XXA Fall on same level from slipping, tripping and stumbling without subsequent striking against object, initial encounter: Secondary | ICD-10-CM | POA: Diagnosis not present

## 2016-05-04 DIAGNOSIS — W19XXXA Unspecified fall, initial encounter: Secondary | ICD-10-CM

## 2016-05-04 MED ORDER — BACITRACIN ZINC 500 UNIT/GM EX OINT
TOPICAL_OINTMENT | CUTANEOUS | Status: AC
  Filled 2016-05-04: qty 0.9

## 2016-05-04 MED ORDER — ACETAMINOPHEN 500 MG PO TABS
1000.0000 mg | ORAL_TABLET | Freq: Once | ORAL | Status: AC
  Administered 2016-05-04: 1000 mg via ORAL
  Filled 2016-05-04: qty 2

## 2016-05-04 NOTE — ED Notes (Signed)
Bed: WA09 Expected date:  Expected time:  Means of arrival:  Comments: 70 yo fall w/ knee injury

## 2016-05-04 NOTE — ED Triage Notes (Signed)
Patient reports fall today while coming out of church. Pain 10/10 left knee. No LOC. Abrasion to right eye. No other complaints.

## 2016-05-04 NOTE — ED Provider Notes (Signed)
[facility] DEPT Provider Note   CSN: [id number] Arrival date & time: 05/04/16  1319  History   Chief Complaint Chief Complaint  Patient presents with  . Fall  . Knee Pain    HPI ASENA IGEL is a 70 y.o. female with pmh as noted below who presents with 5/10 L knee pain s/p fall as she was leaving church this morning.  Pt states she tripped over a step and felt directly on her L knee and L face.  Pt was able to ambulate with mild limp after fall.  Pt denies LOC, headache, nausea, w/t/n in extremities after fall.  Pt denies chest pain, dizziness, light-headedness prior to fall.  Pt taking daily aspirin.    HPI  Past Medical History:  Diagnosis Date  . Hyperlipidemia   . PUD (peptic ulcer disease)    x6    Patient Active Problem List   Diagnosis Date Noted  . PVC's (premature ventricular contractions) 01/16/2016  . Insomnia 06/11/2015  . Mitral valve prolapse 06/11/2015  . Loss of weight 06/11/2015  . [place] arthritis 06/11/2015  . Former smoker 06/11/2015  . Hepatitis C, chronic ([place]) 06/29/2013  . GERD (gastroesophageal reflux disease) 06/29/2013  . Chest pain 08/01/2011  . Hyperlipidemia 08/01/2011    Past Surgical History:  Procedure Laterality Date  . ABDOMINAL HYSTERECTOMY     TAAH, BSO- removed cervix. bladder tack that failed  . APPENDECTOMY    . Bladder tack     failed  . TONSILLECTOMY      OB History    No data available       Home Medications    Prior to Admission medications   Medication Sig Start Date End Date Taking? Authorizing Provider  aspirin 325 MG tablet Take 325 mg by mouth daily.    Historical Provider, MD  estradiol (ESTRACE) 1 MG tablet Take 1 mg by mouth daily.    Historical Provider, MD  lactobacillus acidophilus (BACID) TABS tablet Take 1 tablet by mouth daily.    Historical Provider, MD  omeprazole (PRILOSEC OTC) 20 MG tablet Take 1 tablet (20 mg total) by mouth daily. 07/13/14   Kennyth Arnold, FNP    Family  History Family History  Problem Relation Age of Onset  . Heart disease Mother     MVR; MI 36  . Breast cancer Mother     in 5s  . Heart disease Father     Pacemaker    Social History Social History  Substance Use Topics  . Smoking status: Former Smoker    Packs/day: 1.00    Years: 30.00    Types: Cigarettes    Quit date: 05/19/1990  . Smokeless tobacco: Not on file  . Alcohol use 0.0 oz/week     Comment: Rarely     Allergies   Patient has no known allergies.   Review of Systems Review of Systems  HENT: Positive for facial swelling. Negative for nosebleeds.   Eyes: Negative for visual disturbance.  Respiratory: Negative for shortness of breath.   Cardiovascular: Negative for chest pain and leg swelling.  Gastrointestinal: Negative for abdominal pain, nausea and vomiting.  Musculoskeletal: Positive for arthralgias (L knee), gait problem and joint swelling. Negative for back pain and neck pain.  Skin: Positive for color change (redness over L knee).  Neurological: Negative for tremors, seizures, syncope, facial asymmetry, weakness, light-headedness, numbness and headaches.  Hematological: Negative.   Psychiatric/Behavioral: Negative.      Physical Exam Updated Vital Signs BP  118/62 (BP Location: Right Arm)   Pulse 79   Temp 98 F (36.7 C) (Oral)   Resp 16   SpO2 98%   Physical Exam  Constitutional: She is oriented to person, place, and time. Vital signs are normal. She appears well-developed and well-nourished.  HENT:  Head: Normocephalic and atraumatic.  Nose: Nose normal.  Mouth/Throat: Oropharynx is clear and moist. No oropharyngeal exudate.  Eyes:  No nystagmus. EOM normal and symmetric.  PEERL bilaterally.   Neck: Normal range of motion. Neck supple.  Cardiovascular: Normal rate, regular rhythm, normal heart sounds and intact distal pulses.   Pulmonary/Chest: Effort normal. She has no wheezes.  Abdominal: Soft. There is no tenderness.   Musculoskeletal:  Mild erythema and minimal edema over L knee.  Mild tenderness at lateral knee joint line.  No signs of effusion. Passive and active L knee ROM intact with normal J tracking of patella.  No bony tenderness over L patella or tibial tuberosity.  Negative Lachmann's.  Negative posterior drawer test.  Negative valgus/varus stress test.    Lymphadenopathy:    She has no cervical adenopathy.  Neurological: She is alert and oriented to person, place, and time.  Strength 5/5 in bilateral lower extremities. Sensation to light touch intact in bilateral lower extremities.   Skin: Skin is warm and dry.  Psychiatric: She has a normal mood and affect. Her behavior is normal.  Nursing note and vitals reviewed.    ED Treatments / Results  Labs (all labs ordered are listed, but only abnormal results are displayed) Labs Reviewed - No data to display  EKG  EKG Interpretation None       Radiology Dg Knee Complete 4 Views Left  Result Date: 05/04/2016 CLINICAL DATA:  Fall today at church; pain in lateral aspect of left knee; no previous injury EXAM: LEFT KNEE - COMPLETE 4+ VIEW COMPARISON:  None. FINDINGS: There is mild narrowing of the medial compartment. Otherwise, no significant degenerative changes. No acute fracture, subluxation, or joint effusion. IMPRESSION: No evidence for acute  abnormality. Electronically Signed   By: Nolon Nations M.D.   On: 05/04/2016 14:12    Procedures Procedures (including critical care time)  Medications Ordered in ED Medications  acetaminophen (TYLENOL) tablet 1,000 mg (1,000 mg Oral Given 05/04/16 1430)     Initial Impression / Assessment and Plan / ED Course  I have reviewed the triage vital signs and the nursing notes.  Pertinent labs & imaging results that were available during my care of the patient were reviewed by me and considered in my medical decision making (see chart for details).  Clinical Course    Knee x-rays negative  for acute injury, remarkable for narrowing of medial compartment.  X-ray findings discussed with pt.  Pt received tylenol in ED for pain.  Pt able to ambulate to bathroom with tolerate, mild knee pain while in ED without assist. Pt will be discharged with knee brace.  Pt declined crutches.  Instructed pt to take ibuprofen/tylenol, RICE, follow up with PCP as needed.  ED return instructions given, pt verbalized understanding and agreeable to dispo plan.  Final Clinical Impressions(s) / ED Diagnoses   Final diagnoses:  Fall, initial encounter  Acute pain of left knee    New Prescriptions New Prescriptions   No medications on file     Kinnie Feil, PA-C 05/04/16 [facility], MD 05/08/16 1732

## 2016-05-04 NOTE — Discharge Instructions (Signed)
Your knee x-rays today were negative.    Please take ibuprofen or tylenol every 6 hours for the next 2-3 days for inflammation and pain.  After 2-3 days you may decrease your dose to once daily, as tolerated.    Rest, ice, elevation and knee brace will help with knee inflammation as well.  Follow up with your primary care doctor if you develop long term knee pain. Return to emergency room if your symptoms worsen before you are able to make an appointment with your primary care doctor.

## 2016-06-14 DIAGNOSIS — M25562 Pain in left knee: Secondary | ICD-10-CM | POA: Diagnosis not present

## 2016-06-14 DIAGNOSIS — M79642 Pain in left hand: Secondary | ICD-10-CM | POA: Diagnosis not present

## 2016-08-13 DIAGNOSIS — M25512 Pain in left shoulder: Secondary | ICD-10-CM | POA: Diagnosis not present

## 2016-08-30 DIAGNOSIS — M25512 Pain in left shoulder: Secondary | ICD-10-CM | POA: Diagnosis not present

## 2016-09-04 DIAGNOSIS — M25512 Pain in left shoulder: Secondary | ICD-10-CM | POA: Diagnosis not present

## 2016-09-06 DIAGNOSIS — M25571 Pain in right ankle and joints of right foot: Secondary | ICD-10-CM | POA: Diagnosis not present

## 2016-09-06 DIAGNOSIS — S82831D Other fracture of upper and lower end of right fibula, subsequent encounter for closed fracture with routine healing: Secondary | ICD-10-CM | POA: Diagnosis not present

## 2016-09-06 DIAGNOSIS — S82831A Other fracture of upper and lower end of right fibula, initial encounter for closed fracture: Secondary | ICD-10-CM | POA: Diagnosis not present

## 2016-09-11 DIAGNOSIS — S82831D Other fracture of upper and lower end of right fibula, subsequent encounter for closed fracture with routine healing: Secondary | ICD-10-CM | POA: Diagnosis not present

## 2016-09-18 DIAGNOSIS — S82831D Other fracture of upper and lower end of right fibula, subsequent encounter for closed fracture with routine healing: Secondary | ICD-10-CM | POA: Diagnosis not present

## 2016-10-02 DIAGNOSIS — M25571 Pain in right ankle and joints of right foot: Secondary | ICD-10-CM | POA: Diagnosis not present

## 2016-10-02 DIAGNOSIS — S82831D Other fracture of upper and lower end of right fibula, subsequent encounter for closed fracture with routine healing: Secondary | ICD-10-CM | POA: Diagnosis not present

## 2016-10-22 DIAGNOSIS — M25571 Pain in right ankle and joints of right foot: Secondary | ICD-10-CM | POA: Diagnosis not present

## 2016-10-22 DIAGNOSIS — S82831D Other fracture of upper and lower end of right fibula, subsequent encounter for closed fracture with routine healing: Secondary | ICD-10-CM | POA: Diagnosis not present

## 2016-11-06 DIAGNOSIS — S82831D Other fracture of upper and lower end of right fibula, subsequent encounter for closed fracture with routine healing: Secondary | ICD-10-CM | POA: Diagnosis not present

## 2016-11-06 DIAGNOSIS — M7989 Other specified soft tissue disorders: Secondary | ICD-10-CM | POA: Diagnosis not present

## 2016-11-10 NOTE — Progress Notes (Signed)
Madison Blair has come into my office at the [facility] for a BP check.  She states that on Friday (3 days ago) she was having waves of nausea and dizziness when she would move her head forward or backward.  She took her BP at home and had gotten 180/100.  She took a full strength aspirin and rested.  On recheck that day it was 160/90.  She denies having had pain or any other symptoms.   Today she reports feeling fine & no pain. BP 130/80.  She states she may call her PMD and let him know anyway.

## 2016-11-25 DIAGNOSIS — M8580 Other specified disorders of bone density and structure, unspecified site: Secondary | ICD-10-CM | POA: Diagnosis not present

## 2016-11-25 DIAGNOSIS — N959 Unspecified menopausal and perimenopausal disorder: Secondary | ICD-10-CM | POA: Diagnosis not present

## 2016-11-25 DIAGNOSIS — R32 Unspecified urinary incontinence: Secondary | ICD-10-CM | POA: Diagnosis not present

## 2016-11-25 DIAGNOSIS — Z6822 Body mass index (BMI) 22.0-22.9, adult: Secondary | ICD-10-CM | POA: Diagnosis not present

## 2016-11-27 ENCOUNTER — Other Ambulatory Visit: Payer: Self-pay | Admitting: Obstetrics & Gynecology

## 2016-11-27 DIAGNOSIS — E2839 Other primary ovarian failure: Secondary | ICD-10-CM

## 2016-12-02 ENCOUNTER — Ambulatory Visit
Admission: RE | Admit: 2016-12-02 | Discharge: 2016-12-02 | Disposition: A | Discharge status: Home / Self Care | Payer: PPO | Source: Ambulatory Visit | Attending: Obstetrics & Gynecology | Admitting: Obstetrics & Gynecology

## 2016-12-02 DIAGNOSIS — M8589 Other specified disorders of bone density and structure, multiple sites: Secondary | ICD-10-CM | POA: Diagnosis not present

## 2016-12-02 DIAGNOSIS — Z780 Asymptomatic menopausal state: Secondary | ICD-10-CM | POA: Diagnosis not present

## 2016-12-02 DIAGNOSIS — E2839 Other primary ovarian failure: Secondary | ICD-10-CM

## 2016-12-04 DIAGNOSIS — G8929 Other chronic pain: Secondary | ICD-10-CM | POA: Diagnosis not present

## 2016-12-04 DIAGNOSIS — M25571 Pain in right ankle and joints of right foot: Secondary | ICD-10-CM | POA: Diagnosis not present

## 2016-12-04 DIAGNOSIS — S82831A Other fracture of upper and lower end of right fibula, initial encounter for closed fracture: Secondary | ICD-10-CM | POA: Diagnosis not present

## 2016-12-04 DIAGNOSIS — S8261XA Displaced fracture of lateral malleolus of right fibula, initial encounter for closed fracture: Secondary | ICD-10-CM | POA: Diagnosis not present

## 2016-12-10 DIAGNOSIS — D1801 Hemangioma of skin and subcutaneous tissue: Secondary | ICD-10-CM | POA: Diagnosis not present

## 2016-12-10 DIAGNOSIS — L905 Scar conditions and fibrosis of skin: Secondary | ICD-10-CM | POA: Diagnosis not present

## 2016-12-10 DIAGNOSIS — D2262 Melanocytic nevi of left upper limb, including shoulder: Secondary | ICD-10-CM | POA: Diagnosis not present

## 2016-12-10 DIAGNOSIS — L821 Other seborrheic keratosis: Secondary | ICD-10-CM | POA: Diagnosis not present

## 2016-12-10 DIAGNOSIS — Z85828 Personal history of other malignant neoplasm of skin: Secondary | ICD-10-CM | POA: Diagnosis not present

## 2016-12-10 DIAGNOSIS — D2261 Melanocytic nevi of right upper limb, including shoulder: Secondary | ICD-10-CM | POA: Diagnosis not present

## 2016-12-10 DIAGNOSIS — D485 Neoplasm of uncertain behavior of skin: Secondary | ICD-10-CM | POA: Diagnosis not present

## 2016-12-10 DIAGNOSIS — L814 Other melanin hyperpigmentation: Secondary | ICD-10-CM | POA: Diagnosis not present

## 2016-12-10 DIAGNOSIS — D2271 Melanocytic nevi of right lower limb, including hip: Secondary | ICD-10-CM | POA: Diagnosis not present

## 2016-12-10 DIAGNOSIS — D225 Melanocytic nevi of trunk: Secondary | ICD-10-CM | POA: Diagnosis not present

## 2016-12-10 DIAGNOSIS — L820 Inflamed seborrheic keratosis: Secondary | ICD-10-CM | POA: Diagnosis not present

## 2016-12-11 DIAGNOSIS — M7989 Other specified soft tissue disorders: Secondary | ICD-10-CM | POA: Diagnosis not present

## 2016-12-11 DIAGNOSIS — S82831D Other fracture of upper and lower end of right fibula, subsequent encounter for closed fracture with routine healing: Secondary | ICD-10-CM | POA: Diagnosis not present

## 2016-12-11 DIAGNOSIS — M25571 Pain in right ankle and joints of right foot: Secondary | ICD-10-CM | POA: Diagnosis not present

## 2016-12-11 DIAGNOSIS — Z1231 Encounter for screening mammogram for malignant neoplasm of breast: Secondary | ICD-10-CM | POA: Diagnosis not present

## 2016-12-18 DIAGNOSIS — M25571 Pain in right ankle and joints of right foot: Secondary | ICD-10-CM | POA: Diagnosis not present

## 2016-12-24 DIAGNOSIS — N390 Urinary tract infection, site not specified: Secondary | ICD-10-CM | POA: Diagnosis not present

## 2016-12-25 DIAGNOSIS — M25571 Pain in right ankle and joints of right foot: Secondary | ICD-10-CM | POA: Diagnosis not present

## 2016-12-25 DIAGNOSIS — S82831D Other fracture of upper and lower end of right fibula, subsequent encounter for closed fracture with routine healing: Secondary | ICD-10-CM | POA: Diagnosis not present

## 2016-12-30 ENCOUNTER — Encounter: Payer: Self-pay | Admitting: Family Medicine

## 2016-12-30 ENCOUNTER — Ambulatory Visit (INDEPENDENT_AMBULATORY_CARE_PROVIDER_SITE_OTHER): Payer: PPO | Admitting: Family Medicine

## 2016-12-30 ENCOUNTER — Other Ambulatory Visit: Payer: Self-pay | Admitting: Family Medicine

## 2016-12-30 VITALS — BP 138/86 | HR 75 | Temp 97.8°F | Ht 65.5 in | Wt 132.4 lb

## 2016-12-30 DIAGNOSIS — R768 Other specified abnormal immunological findings in serum: Secondary | ICD-10-CM

## 2016-12-30 DIAGNOSIS — E785 Hyperlipidemia, unspecified: Secondary | ICD-10-CM | POA: Diagnosis not present

## 2016-12-30 DIAGNOSIS — R634 Abnormal weight loss: Secondary | ICD-10-CM

## 2016-12-30 DIAGNOSIS — Z23 Encounter for immunization: Secondary | ICD-10-CM | POA: Diagnosis not present

## 2016-12-30 DIAGNOSIS — I493 Ventricular premature depolarization: Secondary | ICD-10-CM

## 2016-12-30 DIAGNOSIS — Z1211 Encounter for screening for malignant neoplasm of colon: Secondary | ICD-10-CM

## 2016-12-30 DIAGNOSIS — Z0000 Encounter for general adult medical examination without abnormal findings: Secondary | ICD-10-CM | POA: Diagnosis not present

## 2016-12-30 DIAGNOSIS — M8580 Other specified disorders of bone density and structure, unspecified site: Secondary | ICD-10-CM | POA: Diagnosis not present

## 2016-12-30 DIAGNOSIS — E559 Vitamin D deficiency, unspecified: Secondary | ICD-10-CM | POA: Diagnosis not present

## 2016-12-30 LAB — LIPID PANEL
Cholesterol: 213 mg/dL — ABNORMAL HIGH (ref 0–200)
HDL: 71.6 mg/dL (ref >39.00)
LDL Cholesterol: 111 mg/dL — ABNORMAL HIGH (ref 0–99)
NonHDL: 141.89
Total CHOL/HDL Ratio: 3
Triglycerides: 154 mg/dL — ABNORMAL HIGH (ref 0.0–149.0)
VLDL: 30.8 mg/dL (ref 0.0–40.0)

## 2016-12-30 LAB — CBC
HCT: 42 % (ref 36.0–46.0)
Hemoglobin: 13.5 g/dL (ref 12.0–15.0)
MCHC: 32.2 g/dL (ref 30.0–36.0)
MCV: 86.8 fl (ref 78.0–100.0)
Platelets: 277 10*3/uL (ref 150.0–400.0)
RBC: 4.84 Mil/uL (ref 3.87–5.11)
RDW: 14.1 % (ref 11.5–15.5)
WBC: 6.3 10*3/uL (ref 4.0–10.5)

## 2016-12-30 LAB — URINALYSIS
Bilirubin Urine: NEGATIVE
Hgb urine dipstick: NEGATIVE
Ketones, ur: NEGATIVE
Leukocytes, UA: NEGATIVE
Nitrite: NEGATIVE
Specific Gravity, Urine: 1.025 (ref 1.000–1.030)
Total Protein, Urine: NEGATIVE
Urine Glucose: NEGATIVE
Urobilinogen, UA: 0.2 (ref 0.0–1.0)
pH: 5.5 (ref 5.0–8.0)

## 2016-12-30 LAB — COMPREHENSIVE METABOLIC PANEL
ALT: 8 U/L (ref 0–35)
AST: 13 U/L (ref 0–37)
Albumin: 4 g/dL (ref 3.5–5.2)
Alkaline Phosphatase: 62 U/L (ref 39–117)
BUN: 12 mg/dL (ref 6–23)
CO2: 28 mEq/L (ref 19–32)
Calcium: 9.3 mg/dL (ref 8.4–10.5)
Chloride: 105 mEq/L (ref 96–112)
Creatinine, Ser: 0.69 mg/dL (ref 0.40–1.20)
GFR: 89.73 mL/min (ref >60.00)
Glucose, Bld: 90 mg/dL (ref 70–99)
Potassium: 4.6 mEq/L (ref 3.5–5.1)
Sodium: 140 mEq/L (ref 135–145)
Total Bilirubin: 0.5 mg/dL (ref 0.2–1.2)
Total Protein: 6.4 g/dL (ref 6.0–8.3)

## 2016-12-30 LAB — VITAMIN D 25 HYDROXY (VIT D DEFICIENCY, FRACTURES): VITD: 23.93 ng/mL — ABNORMAL LOW (ref 30.00–100.00)

## 2016-12-30 MED ORDER — VITAMIN D (ERGOCALCIFEROL) 1.25 MG (50000 UNIT) PO CAPS: 50000.0000 [IU] | ORAL_CAPSULE | ORAL | 0 refills | Status: DC

## 2016-12-30 NOTE — Assessment & Plan Note (Signed)
Reactive hepatitis C test in the past. Quantitative analysis negative for Hep C. Either had and cleared or false positive test.

## 2016-12-30 NOTE — Addendum Note (Signed)
Addended by: Tomi Likens on: 12/30/2016 11:14 AM   Modules accepted: Orders

## 2016-12-30 NOTE — Assessment & Plan Note (Addendum)
No more bradycardia. Denies palpitations. Cleared by cardiology last year.

## 2016-12-30 NOTE — Patient Instructions (Addendum)
Pneumovax 23 today  You can also get the shingles shot at your pharmacy if they have it available.   Get your flu shot in the fall  Please stop by lab before you go

## 2016-12-30 NOTE — Addendum Note (Signed)
Addended by: Mariam Dollar, Roselyn Reef M on: 12/30/2016 11:25 AM   Modules accepted: Orders

## 2016-12-30 NOTE — Progress Notes (Addendum)
Phone: [phone]  Subjective:  Patient presents today for their annual physical. Chief complaint-noted.   See problem oriented charting- ROS- full  review of systems was completed and negative except including No chest pain or shortness of breath. No headache or blurry vision.    The following were reviewed and entered/updated in epic: Past Medical History:  Diagnosis Date  . Hyperlipidemia   . PUD (peptic ulcer disease)    x6   Patient Active Problem List   Diagnosis Date Noted  . Vitamin D deficiency 12/30/2016    Priority: Medium  . PVC's (premature ventricular contractions) 01/16/2016    Priority: Medium  . Positive hepatitis C antibody test 06/29/2013    Priority: Medium  . Hyperlipidemia 08/01/2011    Priority: Medium  . Insomnia 06/11/2015    Priority: Low  . Mitral valve prolapse 06/11/2015    Priority: Low  . 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 Former smoker 06/11/2015    Priority: Low  . GERD (gastroesophageal reflux disease) 06/29/2013    Priority: Low  . Chest pain 08/01/2011    Priority: Low  . Osteopenia 12/30/2016   Past Surgical History:  Procedure Laterality Date  . ABDOMINAL HYSTERECTOMY     TAAH, BSO- removed cervix. bladder tack that failed  . APPENDECTOMY    . Bladder tack     failed  . TONSILLECTOMY      Family History  Problem Relation Age of Onset  . Heart disease Mother        MVR; MI 30  . Breast cancer Mother        in 24s  . Heart disease Father        Pacemaker    Medications- reviewed and updated Current Outpatient Prescriptions  Medication Sig Dispense Refill  . aspirin 325 MG tablet Take 325 mg by mouth daily.    Marland Kitchen estradiol (ESTRACE) 1 MG tablet Take 1 mg by mouth daily.    . nitrofurantoin, macrocrystal-monohydrate, (MACROBID) 100 MG capsule Take 1 capsule by mouth. Take 1 capsule after intercourse    . omeprazole (PRILOSEC OTC) 20 MG tablet Take 1 tablet (20 mg total) by mouth daily. 90 tablet 1  Also  takes caltrate No current facility-administered medications for this visit.     Allergies-reviewed and updated No Known Allergies  Social History   Social History  . Marital status: Divorced    Spouse name: N/A  . Number of children: 2   . Years of education: N/A   Occupational History  .      Retired   Social History Main Topics  . Smoking status: Former Smoker    Packs/day: 1.00    Years: 30.00    Types: Cigarettes    Quit date: 05/19/1990  . Smokeless tobacco: Never Used  . Alcohol use 0.0 oz/week     Comment: Rarely  . Drug use: No  . Sexual activity: Not Asked   Other Topics Concern  . None   Social History Narrative   Divorced. 2 children (daughter 71, son 25 in 2017). 2 grandchildren from daughter 3361 month old [name], mckenzie 1 month in 2017).       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 Oropharynx normal Neck: no thyromegaly CV: RRRmidsystolic click in mitral area. No rubs or gallops Lungs: CTAB no crackles, wheeze, rhonchi Abdomen: soft/nontender/nondistended/normal bowel sounds. No rebound or guarding.  Ext: no edema Skin: warm, dry Neuro: grossly normal, moves all extremities, PERRLA  Assessment/Plan:  71 y.o. female presenting for annual physical.  Health Maintenance counseling: 1. Anticipatory guidance: Patient counseled regarding regular dental exams q6 months, eye exams - yearly, wearing seatbelts.  2. Risk factor reduction:  Advised patient of need for regular exercise and diet rich and fruits and vegetables to reduce risk of heart attack  and stroke. Exercise- [employer] 3 days a week. Diet-stable weight- balanced diet.  Wt Readings from Last 3 Encounters:  12/30/16 132 lb 6.4 oz (60.1 kg)  01/16/16 132 lb 9.6 oz (60.1 kg)  12/31/15 130 lb (59 kg)  3. Immunizations/screenings/ancillary studies- pneumovax 23 today. Advised flu shot in the fall Immunization History  Administered Date(s) Administered  . Pneumococcal Conjugate-13 07/04/2013  . Tdap 06/29/2010  4. Cervical cancer screening- hysterectomy for benign reasons- had bleeding issues. Still doing Paps with GYn (aged out though) 5. Breast cancer screening-  breast exam with GYn and mammogram 08/27/15.  6. Colon cancer screening - reports colonoscopy 10 years ago but we do not have records. Needs one regardless so will refer today.  7. Skin cancer screening- goes to [facility] dermatology at least yearly  Status of chronic or acute concerns   Had 2 falls in last year- one was due to a covered up difference between curb and [place] at church. Other led to fracture of right ankle- stepping down off curb but there was an area that was broken that she didn't see. Both sound to be true mechanical falls.   Osteopenia- started on caltrate by GYN  HLD- still taking aspirin 325 mg. Update lipids today. 10 year risk has not been elevated above treatment levels  PUD x6 in past so remains on PPI for GERD- helping  Loss of weight Weight loss issues have resolved. Still needs to see GI.   Positive hepatitis C antibody test Reactive hepatitis C test in the past. Quantitative analysis negative for Hep C. Either had and cleared or false positive test.   PVC's (premature ventricular contractions) No more bradycardia. Denies palpitations. Cleared by cardiology last year.  no further testing  1 year CPE  Orders Placed This Encounter  Procedures  . CBC    [place]  . Comprehensive metabolic panel    [employer]    Order Specific Question:   Has the patient fasted?    Answer:   No  . Lipid  panel    [place]    Order Specific Question:   Has the patient fasted?    Answer:   No  . VITAMIN D 25 Hydroxy (Vit-D Deficiency, Fractures)    [place]  . Urinalysis    Standing Status:   Future    Standing Expiration Date:   12/30/2017  . Ambulatory referral to Gastroenterology    Referral Priority:   Routine    Referral Type:   Consultation    Referral Reason:   Specialty Services Required    Number of Visits Requested:   1    Meds ordered this encounter  Medications  . nitrofurantoin, macrocrystal-monohydrate, (  MACROBID) 100 MG capsule    Sig: Take 1 capsule by mouth. Take 1 capsule after intercourse    Return precautions advised.  Garret Reddish, MD

## 2016-12-30 NOTE — Assessment & Plan Note (Signed)
Weight loss issues have resolved. Still needs to see GI.

## 2017-04-30 DIAGNOSIS — H16223 Keratoconjunctivitis sicca, not specified as Sjogren's, bilateral: Secondary | ICD-10-CM | POA: Diagnosis not present

## 2017-05-21 DIAGNOSIS — N814 Uterovaginal prolapse, unspecified: Secondary | ICD-10-CM | POA: Diagnosis not present

## 2017-06-04 ENCOUNTER — Encounter: Payer: Self-pay | Admitting: Family Medicine

## 2017-06-04 ENCOUNTER — Telehealth: Payer: Self-pay | Admitting: Family Medicine

## 2017-06-04 DIAGNOSIS — Z1211 Encounter for screening for malignant neoplasm of colon: Secondary | ICD-10-CM

## 2017-06-04 NOTE — Telephone Encounter (Signed)
Madison Blair- when we referred patient she was told didn't need a colonoscopy until 02/2019. That being said- we need a copy of her report from [facility]- can you please request a copy of the colonoscopy?    From last referral:  Per [facility] pt is not due until 10/20 pt need to contact the business office inform pt of this she stated she will give their office a call        Type Date User Summary Attachment  General 12/30/2016 10:56 AM [name] - -  Note   FAXED REFERRAL / NOTES TO  [facility]   [address] [place], [place] [address] Call: [phone] Fax: [phone]        Type Date User Summary Attachment  Provider Comments 12/30/2016 10:49 AM Yong Channel Brayton Mars, MD Provider Comments -  Note   [facility] Dr. Amedeo Plenty

## 2017-06-05 NOTE — Telephone Encounter (Signed)
Why were we told previously that she did not need a colonoscopy until 2020 by [facility]? See note below.   Madison Blair- can you inform patient of need for colonoscopy and place a new referral?

## 2017-06-05 NOTE — Telephone Encounter (Signed)
Patient wants donna to call her. Called office no answer

## 2017-06-05 NOTE — Telephone Encounter (Signed)
I called and spoke with Madison Blair at [facility] to obtain the colonoscopy records requested. [facility] stated that they do not have any records due to the patient never coming in for an appointment.

## 2017-06-05 NOTE — Telephone Encounter (Signed)
Left message on voicemail to call office.  

## 2017-06-05 NOTE — Telephone Encounter (Signed)
Left message on voicemail to call office. Please tell pt there is no record of Colonoscopy so Dr. Yong Channel is going to place a referral for one to be done and someone will contact her about an appointment.

## 2017-06-05 NOTE — Addendum Note (Signed)
Addended by: Marian Sorrow on: 06/05/2017 03:12 PM   Modules accepted: Orders

## 2017-06-09 DIAGNOSIS — N393 Stress incontinence (female) (male): Secondary | ICD-10-CM | POA: Diagnosis not present

## 2017-06-24 NOTE — Telephone Encounter (Signed)
Frustrating that Madison Blair previously reported "I called and spoke with Amber at [facility] to obtain the colonoscopy records requested. [facility] stated that they do not have any records due to the patient never coming in for an appointment. "  Roselyn Reef- can you call Ms. Noack and thank her for her patience in this as we have struggled to get accurate information

## 2017-06-24 NOTE — Telephone Encounter (Signed)
Pt states she saw Dr Teena Irani at [facility] for her colonoscopy. Pt states she remembers quite well.

## 2017-06-24 NOTE — Telephone Encounter (Signed)
Left message on voicemail to call office.  

## 2017-06-24 NOTE — Telephone Encounter (Signed)
Patient declined referral stating she did not need referral until next year.    LVM-I called the patient and asked her to let us know which gastroenterologist she saw.  She previously had reported [facility] they had no record of colonoscopy.  We need records of her last colonoscopy.  I asked her to call us back and let us know where she had been seen.

## 2017-06-24 NOTE — Telephone Encounter (Signed)
Called [facility] and spoke to [place] she found pt's report form 2010. Will fax report over.

## 2017-06-25 NOTE — Telephone Encounter (Signed)
Spoke to pt, told her we received her colonoscopy report from [facility] and it was done 02/26/2009 and good for 10 years. Told her thank you for being patient. Pt verbalized understanding.

## 2017-07-07 DIAGNOSIS — H04123 Dry eye syndrome of bilateral lacrimal glands: Secondary | ICD-10-CM | POA: Diagnosis not present

## 2017-08-01 DIAGNOSIS — H6122 Impacted cerumen, left ear: Secondary | ICD-10-CM | POA: Diagnosis not present

## 2017-12-16 DIAGNOSIS — D1801 Hemangioma of skin and subcutaneous tissue: Secondary | ICD-10-CM | POA: Diagnosis not present

## 2017-12-16 DIAGNOSIS — L821 Other seborrheic keratosis: Secondary | ICD-10-CM | POA: Diagnosis not present

## 2017-12-16 DIAGNOSIS — Z85828 Personal history of other malignant neoplasm of skin: Secondary | ICD-10-CM | POA: Diagnosis not present

## 2017-12-16 DIAGNOSIS — L814 Other melanin hyperpigmentation: Secondary | ICD-10-CM | POA: Diagnosis not present

## 2017-12-16 DIAGNOSIS — D225 Melanocytic nevi of trunk: Secondary | ICD-10-CM | POA: Diagnosis not present

## 2017-12-16 DIAGNOSIS — C44311 Basal cell carcinoma of skin of nose: Secondary | ICD-10-CM | POA: Diagnosis not present

## 2017-12-28 DIAGNOSIS — Z01419 Encounter for gynecological examination (general) (routine) without abnormal findings: Secondary | ICD-10-CM | POA: Diagnosis not present

## 2017-12-28 DIAGNOSIS — Z6821 Body mass index (BMI) 21.0-21.9, adult: Secondary | ICD-10-CM | POA: Diagnosis not present

## 2017-12-28 DIAGNOSIS — Z1231 Encounter for screening mammogram for malignant neoplasm of breast: Secondary | ICD-10-CM | POA: Diagnosis not present

## 2017-12-28 LAB — HM MAMMOGRAPHY

## 2017-12-31 ENCOUNTER — Encounter: Payer: Self-pay | Admitting: Family Medicine

## 2017-12-31 ENCOUNTER — Ambulatory Visit (INDEPENDENT_AMBULATORY_CARE_PROVIDER_SITE_OTHER): Payer: PPO | Admitting: Family Medicine

## 2017-12-31 VITALS — BP 132/78 | HR 75 | Temp 98.0°F | Ht 64.5 in | Wt 125.0 lb

## 2017-12-31 DIAGNOSIS — D692 Other nonthrombocytopenic purpura: Secondary | ICD-10-CM

## 2017-12-31 DIAGNOSIS — R634 Abnormal weight loss: Secondary | ICD-10-CM | POA: Diagnosis not present

## 2017-12-31 DIAGNOSIS — H6122 Impacted cerumen, left ear: Secondary | ICD-10-CM

## 2017-12-31 DIAGNOSIS — Z0000 Encounter for general adult medical examination without abnormal findings: Secondary | ICD-10-CM | POA: Diagnosis not present

## 2017-12-31 DIAGNOSIS — Z87891 Personal history of nicotine dependence: Secondary | ICD-10-CM

## 2017-12-31 DIAGNOSIS — E559 Vitamin D deficiency, unspecified: Secondary | ICD-10-CM

## 2017-12-31 DIAGNOSIS — E785 Hyperlipidemia, unspecified: Secondary | ICD-10-CM | POA: Diagnosis not present

## 2017-12-31 LAB — CBC WITH DIFFERENTIAL/PLATELET
Basophils Absolute: 0 10*3/uL (ref 0.0–0.1)
Basophils Relative: 0.5 % (ref 0.0–3.0)
Eosinophils Absolute: 0.1 10*3/uL (ref 0.0–0.7)
Eosinophils Relative: 1.1 % (ref 0.0–5.0)
HCT: 40.1 % (ref 36.0–46.0)
Hemoglobin: 13.2 g/dL (ref 12.0–15.0)
Lymphocytes Relative: 36.9 % (ref 12.0–46.0)
Lymphs Abs: 2.3 10*3/uL (ref 0.7–4.0)
MCHC: 32.9 g/dL (ref 30.0–36.0)
MCV: 85.3 fl (ref 78.0–100.0)
Monocytes Absolute: 0.4 10*3/uL (ref 0.1–1.0)
Monocytes Relative: 7.2 % (ref 3.0–12.0)
Neutro Abs: 3.3 10*3/uL (ref 1.4–7.7)
Neutrophils Relative %: 54.3 % (ref 43.0–77.0)
Platelets: 297 10*3/uL (ref 150.0–400.0)
RBC: 4.7 Mil/uL (ref 3.87–5.11)
RDW: 14.1 % (ref 11.5–15.5)
WBC: 6.1 10*3/uL (ref 4.0–10.5)

## 2017-12-31 LAB — LIPID PANEL
Cholesterol: 233 mg/dL — ABNORMAL HIGH (ref 0–200)
HDL: 74.5 mg/dL (ref >39.00)
LDL Cholesterol: 121 mg/dL — ABNORMAL HIGH (ref 0–99)
NonHDL: 158.53
Total CHOL/HDL Ratio: 3
Triglycerides: 187 mg/dL — ABNORMAL HIGH (ref 0.0–149.0)
VLDL: 37.4 mg/dL (ref 0.0–40.0)

## 2017-12-31 LAB — COMPREHENSIVE METABOLIC PANEL
ALT: 5 U/L (ref 0–35)
AST: 10 U/L (ref 0–37)
Albumin: 3.9 g/dL (ref 3.5–5.2)
Alkaline Phosphatase: 59 U/L (ref 39–117)
BUN: 15 mg/dL (ref 6–23)
CO2: 28 mEq/L (ref 19–32)
Calcium: 9.5 mg/dL (ref 8.4–10.5)
Chloride: 103 mEq/L (ref 96–112)
Creatinine, Ser: 0.77 mg/dL (ref 0.40–1.20)
GFR: 78.83 mL/min (ref >60.00)
Glucose, Bld: 90 mg/dL (ref 70–99)
Potassium: 5.1 mEq/L (ref 3.5–5.1)
Sodium: 138 mEq/L (ref 135–145)
Total Bilirubin: 0.4 mg/dL (ref 0.2–1.2)
Total Protein: 6.8 g/dL (ref 6.0–8.3)

## 2017-12-31 LAB — POC URINALSYSI DIPSTICK (AUTOMATED)
Blood, UA: NEGATIVE
Glucose, UA: NEGATIVE
Leukocytes, UA: NEGATIVE
Nitrite, UA: NEGATIVE
Protein, UA: NEGATIVE
Spec Grav, UA: >=1.03 — AB (ref 1.010–1.025)
Urobilinogen, UA: 0.2 E.U./dL
pH, UA: 5 (ref 5.0–8.0)

## 2017-12-31 LAB — VITAMIN D 25 HYDROXY (VIT D DEFICIENCY, FRACTURES): VITD: 23.91 ng/mL — ABNORMAL LOW (ref 30.00–100.00)

## 2017-12-31 LAB — TSH: TSH: 2.58 u[IU]/mL (ref 0.35–4.50)

## 2017-12-31 NOTE — Addendum Note (Signed)
Addended by: Frutoso Chase A on: 12/31/2017 09:05 AM   Modules accepted: Orders

## 2017-12-31 NOTE — Progress Notes (Signed)
Phone: [phone]  Subjective:  Patient presents today for their annual physical. Chief complaint-noted.   See problem oriented charting- ROS- full  review of systems was completed and negative except for: insomnia, UTIs after sex if doesn't take macrobid  The following were reviewed and entered/updated in epic: Past Medical History:  Diagnosis Date  . Hyperlipidemia   . PUD (peptic ulcer disease)    x6   Patient Active Problem List   Diagnosis Date Noted  . Vitamin D deficiency 12/30/2016    Priority: Medium  . PVC's (premature ventricular contractions) 01/16/2016    Priority: Medium  . Positive hepatitis C antibody test 06/29/2013    Priority: Medium  . Hyperlipidemia 08/01/2011    Priority: Medium  . Insomnia 06/11/2015    Priority: Low  . Mitral valve prolapse 06/11/2015    Priority: Low  . 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 Former smoker 06/11/2015    Priority: Low  . GERD (gastroesophageal reflux disease) 06/29/2013    Priority: Low  . Chest pain 08/01/2011    Priority: Low  . Osteopenia 12/30/2016   Past Surgical History:  Procedure Laterality Date  . ABDOMINAL HYSTERECTOMY     TAAH, BSO- removed cervix. bladder tack that failed  . APPENDECTOMY    . Bladder tack     failed  . TONSILLECTOMY      Family History  Problem Relation Age of Onset  . Heart disease Mother        MVR; MI 81  . Breast cancer Mother        in 10s  . Heart disease Father        Pacemaker    Medications- reviewed and updated Current Outpatient Medications  Medication Sig Dispense Refill  . aspirin 325 MG tablet Take 325 mg by mouth daily.    . Calcium Carb-Cholecalciferol (CALTRATE 600+D3 PO) Take 1 tablet by mouth daily.    Marland Kitchen estradiol (ESTRACE) 1 MG tablet Take 1.5 mg by mouth daily.     . nitrofurantoin, macrocrystal-monohydrate, (MACROBID) 100 MG capsule Take 1 capsule by mouth. Take 1 capsule after intercourse     No current facility-administered  medications for this visit.     Allergies-reviewed and updated No Known Allergies  Social History   Social History Narrative   Divorced. 2 children (daughter 62, son 12 in 2017). 2 grandchildren from daughter 2826 month old [name], mckenzie 1 month in 2017).       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 Oropharynx normal. TM normal on right. TM normal on left after irrigation Neck: no thyromegaly CV: RRR no murmurs rubs or gallops Lungs: CTAB no crackles, wheeze, rhonchi Abdomen: soft/nontender/nondistended/normal bowel sounds. No rebound or guarding.  Ext: no edema Skin: warm, dry Neuro: grossly normal, moves all extremities, PERRLA  Procedure note: Cerumen noted in left ear.  Irrigation with water and peroxide performed. Full view of tympanic membrane after procedure.  Patient tolerated procedure well  Assessment/Plan:  72 y.o. female presenting for annual  physical.  Health Maintenance counseling: 1. Anticipatory guidance: Patient counseled regarding regular dental exams -q6 months, eye exams - yearly, wearing seatbelts.  2. Risk factor reduction:  Advised patient of need for regular exercise and diet rich and fruits and vegetables to reduce risk of heart attack and stroke. Exercise- back to shag dancing after a 2 year decline- [employer] on Sunday nights- [employer] 3 days a week. Diet- reasonable diet. Weight loss since adding dancing back in  Wt Readings from Last 3 Encounters:  12/31/17 125 lb (56.7 kg)  12/30/16 132 lb 6.4 oz (60.1 kg)  01/16/16 132 lb 9.6 oz (60.1 kg)  3.  Immunizations/screenings/ancillary studies- advised fall flu shot (she declines).  discussed shingrix- declines for now.  Immunization History  Administered Date(s) Administered  . Influenza-Unspecified 03/04/2017  . Pneumococcal Conjugate-13 07/04/2013  . Pneumococcal Polysaccharide-23 12/30/2016  . Tdap 06/29/2010   4. Cervical cancer screening-  hysterectomy for benign reasons. Does paps with GYN still per her and their preference.  5. Breast cancer screening-  breast exam with GYn Dr. Stann Mainland and mammogram on Monday 3d- need to get records of most recent one 6. Colon cancer screening - 02/26/2009- with planned repeat next year.  7. Skin cancer screening-  [place] derm- did have a basal cell on her nose. advised regular sunscreen use. Denies worrisome, changing, or new skin lesions.  8. Birth control/STD check-  Hysterectomy. No protection- they are monogomous, she declines std testing. asyptomatic 9. Osteoporosis screening at 72-  followed by GYN. On caltrate. dexa due next year.   Status of chronic or acute concerns   No falls in last year  UTI prevention from Dr. Stann Mainland- macrobid after sex. He has her on estrace- she is aware of risks of HRT. She gets horrible hot flashes off of it.   PUD x6 in past so remains on PPI at least prn- she is on a new PPI through GYN. Lower stress environment now- all were while working  Positive HCV antibody but quantitative analysis negative for Hep C. Either cleared or false positive  PVCs- no recent palpitations. Was evaluated by cardiology 2 years ago  Sparing melatonin for sleep- helps some. She does feel rested when she gets up. Discussed limiting screentime before bed  Wants labs sent to Dr. Stann Mainland  Hyperlipidemia Mild hyperlipidemia on last years labs with 10 year risk under 10%. Update lipid panel today. We discussed considering statin if risk gets above 10-12%. Mother with heart disease but at 69. Dad with pacemaker, no ischemic history though. She is  on aspirin- with 10 year risk getting near 10% this is reasonable  With HLD and weight loss wants to check TSH  Vitamin D deficiency Vitamin D- low last year, will update today- did 12 week boost last year. 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 We discussed aspirin likely contributes. Will check platelets  Return in about 1 year (around 01/01/2019) for physical.  Lab/Order associations: Preventative health care - Plan: CBC with Differential/Platelet, Comprehensive metabolic panel, Lipid panel, VITAMIN D 25 Hydroxy (Vit-D Deficiency, Fractures), TSH  Vitamin D deficiency - Plan: VITAMIN D 25 Hydroxy (Vit-D Deficiency, Fractures)  Hyperlipidemia, unspecified hyperlipidemia type - Plan: CBC with Differential/Platelet, Comprehensive metabolic panel, Lipid panel, TSH, POCT Urinalysis Dipstick (Automated)  Weight loss - Plan: TSH  Former smoker - Plan: POCT Urinalysis Dipstick (Automated)  Senile purpura (HCC)  Impacted cerumen, left ear  Return precautions advised.  Garret Reddish, MD

## 2017-12-31 NOTE — Patient Instructions (Signed)
Left ear irrigation today  Please stop by lab before you go  [facility] you have another great year!

## 2017-12-31 NOTE — Assessment & Plan Note (Signed)
Mild hyperlipidemia on last years labs with 10 year risk under 10%. Update lipid panel today. We discussed considering statin if risk gets above 10-12%. Mother with heart disease but at 38. Dad with pacemaker, no ischemic history though. She is on aspirin- with 10 year risk getting near 10% this is reasonable  With HLD and weight loss wants to check TSH

## 2017-12-31 NOTE — Assessment & Plan Note (Signed)
Vitamin D- low last year, will update today- did 12 week boost last year. On caltrate with vitamin d

## 2018-01-04 ENCOUNTER — Encounter: Payer: Self-pay | Admitting: Family Medicine

## 2018-03-08 DIAGNOSIS — C44311 Basal cell carcinoma of skin of nose: Secondary | ICD-10-CM | POA: Diagnosis not present

## 2018-03-08 DIAGNOSIS — Z85828 Personal history of other malignant neoplasm of skin: Secondary | ICD-10-CM | POA: Diagnosis not present

## 2018-03-24 ENCOUNTER — Encounter: Payer: Self-pay | Admitting: Family Medicine

## 2018-03-24 DIAGNOSIS — Z85828 Personal history of other malignant neoplasm of skin: Secondary | ICD-10-CM | POA: Insufficient documentation

## 2018-03-24 DIAGNOSIS — Z9889 Other specified postprocedural states: Secondary | ICD-10-CM

## 2018-08-30 ENCOUNTER — Telehealth: Payer: Self-pay | Admitting: Family Medicine

## 2018-08-30 NOTE — Telephone Encounter (Signed)
Copied from [place] [id number]. Topic: Appointment Scheduling - Scheduling Inquiry for Clinic >> Aug 30, 2018  2:52 PM Reyne Dumas L wrote: Reason for CRM:   Pt called and left message on [place].  States that she needs to schedule an appointment today for blood pressure issues.

## 2018-08-30 NOTE — Telephone Encounter (Signed)
Called patient and she said she saw her GYN about bp and got it all worked out.

## 2018-12-07 ENCOUNTER — Other Ambulatory Visit: Payer: Self-pay | Admitting: Obstetrics & Gynecology

## 2018-12-07 DIAGNOSIS — E2839 Other primary ovarian failure: Secondary | ICD-10-CM

## 2018-12-21 DIAGNOSIS — Z85828 Personal history of other malignant neoplasm of skin: Secondary | ICD-10-CM | POA: Diagnosis not present

## 2018-12-21 DIAGNOSIS — L738 Other specified follicular disorders: Secondary | ICD-10-CM | POA: Diagnosis not present

## 2018-12-21 DIAGNOSIS — D2262 Melanocytic nevi of left upper limb, including shoulder: Secondary | ICD-10-CM | POA: Diagnosis not present

## 2018-12-21 DIAGNOSIS — D225 Melanocytic nevi of trunk: Secondary | ICD-10-CM | POA: Diagnosis not present

## 2018-12-21 DIAGNOSIS — C44519 Basal cell carcinoma of skin of other part of trunk: Secondary | ICD-10-CM | POA: Diagnosis not present

## 2018-12-21 DIAGNOSIS — L821 Other seborrheic keratosis: Secondary | ICD-10-CM | POA: Diagnosis not present

## 2018-12-21 DIAGNOSIS — D2271 Melanocytic nevi of right lower limb, including hip: Secondary | ICD-10-CM | POA: Diagnosis not present

## 2018-12-21 DIAGNOSIS — C44719 Basal cell carcinoma of skin of left lower limb, including hip: Secondary | ICD-10-CM | POA: Diagnosis not present

## 2018-12-21 DIAGNOSIS — D1801 Hemangioma of skin and subcutaneous tissue: Secondary | ICD-10-CM | POA: Diagnosis not present

## 2018-12-21 DIAGNOSIS — L814 Other melanin hyperpigmentation: Secondary | ICD-10-CM | POA: Diagnosis not present

## 2018-12-21 DIAGNOSIS — D2272 Melanocytic nevi of left lower limb, including hip: Secondary | ICD-10-CM | POA: Diagnosis not present

## 2018-12-21 DIAGNOSIS — D692 Other nonthrombocytopenic purpura: Secondary | ICD-10-CM | POA: Diagnosis not present

## 2018-12-31 NOTE — Progress Notes (Signed)
Phone: [phone]   Subjective:  Patient presents today for their annual physical. Chief complaint-noted.   See problem oriented charting- ROS- full  review of systems was completed and negative except for: back pain  The following were reviewed and entered/updated in epic: Past Medical History:  Diagnosis Date  . Hyperlipidemia   . PUD (peptic ulcer disease)    x6   Patient Active Problem List   Diagnosis Date Noted  . Vitamin D deficiency 12/30/2016    Priority: Medium  . PVC's (premature ventricular contractions) 01/16/2016    Priority: Medium  . Positive hepatitis C antibody test 06/29/2013    Priority: Medium  . Hyperlipidemia 08/01/2011    Priority: Medium  . Insomnia 06/11/2015    Priority: Low  . Mitral valve prolapse 06/11/2015    Priority: Low  . 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 Former smoker 06/11/2015    Priority: Low  . GERD (gastroesophageal reflux disease) 06/29/2013    Priority: Low  . Chest pain 08/01/2011    Priority: Low  . Senile purpura ([place]) 01/03/2019  . History of Mohs micrographic surgery for skin cancer 03/24/2018  . Osteopenia 12/30/2016   Past Surgical History:  Procedure Laterality Date  . ABDOMINAL HYSTERECTOMY     TAAH, BSO- removed cervix. bladder tack that failed  . APPENDECTOMY    . Bladder tack     failed  . TONSILLECTOMY      Family History  Problem Relation Age of Onset  . Heart disease Mother        MVR; MI 47  . Breast cancer Mother        in 55s  . Heart disease Father        Pacemaker    Medications- reviewed and updated Current Outpatient Medications  Medication Sig Dispense Refill  . aspirin 325 MG tablet Take 325 mg by mouth daily.    Marland Kitchen estradiol (ESTRACE) 1 MG tablet Take 1.5 mg by mouth daily.     . Multiple Vitamins-Minerals (CENTRUM SILVER PO) Take by mouth.    . pantoprazole (PROTONIX) 40 MG tablet TAKE 1 TABLET BY MOUTH ONCE DAILY IN THE MORNING     No current facility-administered  medications for this visit.     Allergies-reviewed and updated No Known Allergies  Social History   Social History Narrative   Divorced. 2 children (daughter 22, son 76 in 2017). 2 grandchildren from daughter 1124 month old [name], mckenzie 1 month in 2017).       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 Oropharynx normal. Bilateral cerumen impaction- removed with curette Neck: no thyromegaly or cervical lymphadenopathy CV: RRR no murmurs rubs or gallops Lungs: CTAB no crackles, wheeze, rhonchi Abdomen: soft/nontender/nondistended/normal bowel sounds. No rebound or guarding.  Ext: no edema Skin: warm, dry Neuro: grossly normal, moves all extremities, PERRLA    Assessment and Plan   73 y.o. female presenting for annual physical.  Health Maintenance counseling: 1. Anticipatory guidance: Patient counseled regarding regular dental exams -q6 months, eye exams - yearly,  avoiding smoking and second hand smoke , limiting alcohol to 1 beverage per day .   2. Risk factor reduction:  Advised patient of need for regular exercise and diet rich and fruits and vegetables to reduce risk of heart attack and stroke. Exercise-shag dancing in the past-now limited by COVID- she has tried to be active at home- has home tracker for walking- even doing walking in home if not a safe to get out . Diet-weight stable, reasonably healthy diet.  Wt Readings from Last 3 Encounters:  01/03/19 125 lb 12.8 oz (57.1 kg)  12/31/17 125 lb (56.7 kg)  12/30/16 132 lb 6.4 oz (60.1 kg)  3. Immunizations/screenings/ancillary studies-recommended fall flu shot.  Discussed  Shingrix- she opts out. States likely wouldn't get covid vaccine- states prefer to just try to stay to herself over risk of medication  Immunization History  Administered Date(s) Administered  . Influenza-Unspecified 03/04/2017  . Pneumococcal Conjugate-13 07/04/2013  . Pneumococcal Polysaccharide-23 12/30/2016  . Tdap 06/29/2010  4. Cervical cancer screening- hysterectomy for benign reasons- still does Paps with GYN per their preference 5. Breast cancer screening-  breast exam with Dr. Stann Mainland and mammogram 12/28/2017 on record- scheduled in september 6. Colon cancer screening - does this with [facility]. will be due for repeat 10-year colonoscopy in October 7. Skin cancer screening-follows with [facility] dermatology-has had history of basal cell carcinoma on the nose and had one on her leg as well. advised regular sunscreen use. Denies worrisome, changing, or new skin lesions.  8. Birth control/STD check- hysterectomy.  Monogamous with current partner 9. Osteoporosis screening at 36- osteopenia per GYN-followed by them and she is on centrum silver (states has calcium and vitamin D). Scheduled for september 10.  Former smoker-quit in the 1990s-no regular screening needed  Status of chronic or acute concerns   Back pain - right low back pain if bending over- clears with ibuprofen. Started after stopping being able to go to [facility]. Encouraged core strengthening   Bp she checks at home and typically 120s over 80s- then sometimes drops into the 90s. Never into 140s. Initial BP high but improved on repeat. Encouraged her to monitor BP at home.  BP Readings from Last 3 Encounters:  01/03/19 132/82  12/31/17 132/78  12/30/16 138/86   Hyperlipidemia -November last year- repeated anticoagulating her ASCVD risk.  Last years values used with today's blood pressure and age equals just under 10%.  We reviewed new guidelines with 7.5% threshold to consider statin - Mother had heart disease but at age 69.  Father  had pacemaker but no ischemic history.  She wants to maintain aspirin. -discussed coronary CT vs. Once week statin if 10 year risk over 10% on labs today   Vitamin D Deficiency -in the past-update vitamin D today.  She is on centrum silver with vitamin d  Insomnia - Has occasional trouble staying asleep.    GERD - Taking Protonix 40 mg daily for ulcer prevention as has had 6 peptic ulcers in past (GI wants her on high dose), managing sx well.    Reactive hepatitis C test in the past-quantitative analysis was negative hepatitis C so has either cleared hepatitis C or this was advised positive.  Senile purpura-still with some easy bruising and bleeding- likely contributed to by aspirin   Recommended follow up: 1 year follow up  Future Appointments  Date Time Provider [place]  01/19/2019  7:00 AM [facility] DX DEXA 1 [facility] CE   Lab/Order associations: fasting  ICD-10-CM   1. Preventative health care  Z00.00 CBC with Differential/Platelet    Comprehensive metabolic panel    Lipid panel    VITAMIN D 25 Hydroxy (Vit-D Deficiency, Fractures)    TSH  2. Hyperlipidemia, unspecified hyperlipidemia type  E78.5 CBC with Differential/Platelet    Comprehensive metabolic panel    Lipid panel    TSH  3. Vitamin D deficiency  E55.9 VITAMIN D 25 Hydroxy (Vit-D Deficiency, Fractures)  4. Senile purpura ([place]) Chronic D69.2    Return precautions advised.  Garret Reddish, MD

## 2018-12-31 NOTE — Patient Instructions (Addendum)
Health Maintenance Due  Topic Date Due  . INFLUENZA VACCINE  12/18/2018  We should have flu shots available by September. Please strongly consider getting flu shot this year. If you get your flu shot at a pharmacy- please let us know.   Keep monitoring blood pressure at home with goal <140/90 on average   Please stop by lab before you go If you do not have mychart- we will call you about results within 5 business days of [place] receiving them.  If you have mychart- we will send your results within 3 business days of [place] receiving them.  If abnormal or we want to clarify a result, we will call or mychart you to make sure you receive the message.  If you have questions or concerns or don't hear within 5-7 days, please send [place] a message or call us.

## 2019-01-03 ENCOUNTER — Ambulatory Visit (INDEPENDENT_AMBULATORY_CARE_PROVIDER_SITE_OTHER): Payer: PPO | Admitting: Family Medicine

## 2019-01-03 ENCOUNTER — Encounter: Payer: Self-pay | Admitting: Family Medicine

## 2019-01-03 ENCOUNTER — Other Ambulatory Visit: Payer: Self-pay

## 2019-01-03 VITALS — BP 132/82 | HR 66 | Temp 98.0°F | Ht 64.5 in | Wt 125.8 lb

## 2019-01-03 DIAGNOSIS — D692 Other nonthrombocytopenic purpura: Secondary | ICD-10-CM | POA: Diagnosis not present

## 2019-01-03 DIAGNOSIS — Z0000 Encounter for general adult medical examination without abnormal findings: Secondary | ICD-10-CM

## 2019-01-03 DIAGNOSIS — E785 Hyperlipidemia, unspecified: Secondary | ICD-10-CM

## 2019-01-03 DIAGNOSIS — E559 Vitamin D deficiency, unspecified: Secondary | ICD-10-CM

## 2019-01-03 LAB — LIPID PANEL
Cholesterol: 232 mg/dL — ABNORMAL HIGH (ref 0–200)
HDL: 67.3 mg/dL (ref >39.00)
NonHDL: 164.23
Total CHOL/HDL Ratio: 3
Triglycerides: 202 mg/dL — ABNORMAL HIGH (ref 0.0–149.0)
VLDL: 40.4 mg/dL — ABNORMAL HIGH (ref 0.0–40.0)

## 2019-01-03 LAB — COMPREHENSIVE METABOLIC PANEL
ALT: 10 U/L (ref 0–35)
AST: 14 U/L (ref 0–37)
Albumin: 3.9 g/dL (ref 3.5–5.2)
Alkaline Phosphatase: 66 U/L (ref 39–117)
BUN: 12 mg/dL (ref 6–23)
CO2: 25 mEq/L (ref 19–32)
Calcium: 9.4 mg/dL (ref 8.4–10.5)
Chloride: 106 mEq/L (ref 96–112)
Creatinine, Ser: 0.74 mg/dL (ref 0.40–1.20)
GFR: 77.42 mL/min (ref >60.00)
Glucose, Bld: 92 mg/dL (ref 70–99)
Potassium: 4.6 mEq/L (ref 3.5–5.1)
Sodium: 139 mEq/L (ref 135–145)
Total Bilirubin: 0.4 mg/dL (ref 0.2–1.2)
Total Protein: 6.8 g/dL (ref 6.0–8.3)

## 2019-01-03 LAB — CBC WITH DIFFERENTIAL/PLATELET
Basophils Absolute: 0 10*3/uL (ref 0.0–0.1)
Basophils Relative: 0.4 % (ref 0.0–3.0)
Eosinophils Absolute: 0 10*3/uL (ref 0.0–0.7)
Eosinophils Relative: 0.7 % (ref 0.0–5.0)
HCT: 38.6 % (ref 36.0–46.0)
Hemoglobin: 12.9 g/dL (ref 12.0–15.0)
Lymphocytes Relative: 32.1 % (ref 12.0–46.0)
Lymphs Abs: 1.7 10*3/uL (ref 0.7–4.0)
MCHC: 33.5 g/dL (ref 30.0–36.0)
MCV: 86.6 fl (ref 78.0–100.0)
Monocytes Absolute: 0.4 10*3/uL (ref 0.1–1.0)
Monocytes Relative: 7.1 % (ref 3.0–12.0)
Neutro Abs: 3.2 10*3/uL (ref 1.4–7.7)
Neutrophils Relative %: 59.7 % (ref 43.0–77.0)
Platelets: 278 10*3/uL (ref 150.0–400.0)
RBC: 4.46 Mil/uL (ref 3.87–5.11)
RDW: 13.4 % (ref 11.5–15.5)
WBC: 5.3 10*3/uL (ref 4.0–10.5)

## 2019-01-03 LAB — LDL CHOLESTEROL, DIRECT: Direct LDL: 135 mg/dL

## 2019-01-03 LAB — TSH: TSH: 2.85 u[IU]/mL (ref 0.35–4.50)

## 2019-01-03 LAB — VITAMIN D 25 HYDROXY (VIT D DEFICIENCY, FRACTURES): VITD: 31.39 ng/mL (ref 30.00–100.00)

## 2019-01-04 ENCOUNTER — Encounter: Payer: Self-pay | Admitting: Family Medicine

## 2019-01-04 MED ORDER — ATORVASTATIN CALCIUM 20 MG PO TABS: ORAL_TABLET | ORAL | 3 refills | Status: DC

## 2019-01-06 ENCOUNTER — Encounter: Payer: Self-pay | Admitting: Family Medicine

## 2019-01-06 NOTE — Telephone Encounter (Signed)
Called pt to check on status. Trying to see if pt needs to see another provider since Dr. Yong Channel is out until Monday but no answer.

## 2019-01-06 NOTE — Telephone Encounter (Signed)
Pt called back and stated she is asymptomatic. Pt denies dizziness headaches and blurred vision. Pt was informed that her number will be routed to Dr. Yong Channel who is out of the office. Pt stated she fills fine and will wait for Dr. Yong Channel to advise Monday. Pt verbalized that she will go to the nearest UC or ED if she develope any sx or if blood pressure continues to elevate.

## 2019-01-10 ENCOUNTER — Encounter: Payer: Self-pay | Admitting: Family Medicine

## 2019-01-11 ENCOUNTER — Encounter: Payer: Self-pay | Admitting: Family Medicine

## 2019-01-12 ENCOUNTER — Emergency Department (HOSPITAL_COMMUNITY)
Admission: EM | Admit: 2019-01-12 | Discharge: 2019-01-12 | Disposition: A | Discharge status: Home / Self Care | Payer: PPO | Attending: Emergency Medicine | Admitting: Emergency Medicine

## 2019-01-12 ENCOUNTER — Encounter (HOSPITAL_COMMUNITY): Payer: Self-pay

## 2019-01-12 ENCOUNTER — Encounter: Payer: Self-pay | Admitting: Family Medicine

## 2019-01-12 ENCOUNTER — Ambulatory Visit: Payer: Self-pay

## 2019-01-12 ENCOUNTER — Other Ambulatory Visit: Payer: Self-pay

## 2019-01-12 ENCOUNTER — Emergency Department (HOSPITAL_COMMUNITY): Payer: PPO

## 2019-01-12 DIAGNOSIS — I10 Essential (primary) hypertension: Secondary | ICD-10-CM | POA: Diagnosis not present

## 2019-01-12 DIAGNOSIS — Z7982 Long term (current) use of aspirin: Secondary | ICD-10-CM | POA: Diagnosis not present

## 2019-01-12 DIAGNOSIS — Z79899 Other long term (current) drug therapy: Secondary | ICD-10-CM | POA: Diagnosis not present

## 2019-01-12 DIAGNOSIS — Z87891 Personal history of nicotine dependence: Secondary | ICD-10-CM | POA: Insufficient documentation

## 2019-01-12 DIAGNOSIS — R0789 Other chest pain: Secondary | ICD-10-CM | POA: Diagnosis not present

## 2019-01-12 DIAGNOSIS — R079 Chest pain, unspecified: Secondary | ICD-10-CM | POA: Diagnosis not present

## 2019-01-12 LAB — CBC
HCT: 38.1 % (ref 36.0–46.0)
Hemoglobin: 12.4 g/dL (ref 12.0–15.0)
MCH: 29 pg (ref 26.0–34.0)
MCHC: 32.5 g/dL (ref 30.0–36.0)
MCV: 89.2 fL (ref 80.0–100.0)
Platelets: 308 10*3/uL (ref 150–400)
RBC: 4.27 MIL/uL (ref 3.87–5.11)
RDW: 12.9 % (ref 11.5–15.5)
WBC: 6.4 10*3/uL (ref 4.0–10.5)
nRBC: 0 % (ref 0.0–0.2)

## 2019-01-12 LAB — BASIC METABOLIC PANEL
Anion gap: 10 (ref 5–15)
BUN: 14 mg/dL (ref 8–23)
CO2: 23 mmol/L (ref 22–32)
Calcium: 9 mg/dL (ref 8.9–10.3)
Chloride: 108 mmol/L (ref 98–111)
Creatinine, Ser: 0.73 mg/dL (ref 0.44–1.00)
GFR calc Af Amer: >60 mL/min (ref >60)
GFR calc non Af Amer: >60 mL/min (ref >60)
Glucose, Bld: 120 mg/dL — ABNORMAL HIGH (ref 70–99)
Potassium: 4 mmol/L (ref 3.5–5.1)
Sodium: 141 mmol/L (ref 135–145)

## 2019-01-12 LAB — TROPONIN I (HIGH SENSITIVITY): Troponin I (High Sensitivity): 3 ng/L (ref <18)

## 2019-01-12 MED ORDER — AMLODIPINE BESYLATE 5 MG PO TABS
2.5000 mg | ORAL_TABLET | Freq: Once | ORAL | Status: AC
  Administered 2019-01-12: 2.5 mg via ORAL
  Filled 2019-01-12: qty 1

## 2019-01-12 MED ORDER — SODIUM CHLORIDE 0.9% FLUSH: 3.0000 mL | Freq: Once | INTRAVENOUS | Status: DC

## 2019-01-12 MED ORDER — AMLODIPINE BESYLATE 2.5 MG PO TABS: 2.5000 mg | ORAL_TABLET | Freq: Every day | ORAL | 0 refills | Status: DC

## 2019-01-12 NOTE — Telephone Encounter (Signed)
FYI  Called pt after getting a message that the [facility] nurse was unable to advise the pt to go to the ED. Pt stated that she really don't want to go the the ED and stated that she just need something to bring her Bp down. Pt also stated that she would reach out to her cardiologist to get his recommendation. Pt stated that she will go to the ED by the end of the day.

## 2019-01-12 NOTE — Telephone Encounter (Signed)
Dr. Yong Channel agreed with the disposition to send pt to ED for chest pain.

## 2019-01-12 NOTE — Telephone Encounter (Signed)
See triage note.

## 2019-01-12 NOTE — Discharge Instructions (Signed)
Take norvasc 2.5 mg daily.   See your doctor for follow up in a week   Recheck blood pressure at home   Return to ER if you have worse chest pain, shortness of breath, back pain

## 2019-01-12 NOTE — Telephone Encounter (Signed)
Pt. Called to report onset of a "sharp" chest pain yesterday AM in bilateral breasts, with pain radiating to upper back.  Stated the "sharp" discomfort lasted a short time, but there is a constant dull pain.  At present time, c/o pain in right breast that goes through to right upper back.  Stated the pain increases with taking deep breath.  Denied shortness of breath, nausea, sweating, or heart palpitations.  Reported she has been monitoring her BP, and her readings are staying in the 160/90 range.  Pt. Stated she does not want to go to the ER.  Called office;  reported pt's sx's to [facility].  Stated that Dr. Yong Channel advised pt. Should go to ER.  Pt's. Call disconnected.  Attempted to call pt. Back x 2.  Left voice message to call back to [phone], to discuss Dr. Ansel Bong recommendations.          Reason for Disposition . [1] Chest pain lasts > 5 minutes AND [2] occurred in past 3 days (72 hours)    Stated onset of bilat breast pain yesterday AM; reported pain in constant and varies from sharp to dull.  At present c/o right breast pain that radiates through to upper right back, and increases with taking deep breath.  At present rated pain at 1/10.  Answer Assessment - Initial Assessment Questions 1. LOCATION: "Where does it hurt?"       In right breast and upper right back; sharp alternating with dull 2. RADIATION: "Does the pain go anywhere else?" (e.g., into neck, jaw, arms, back)    Radiates from front to back  3. ONSET: "When did the chest pain begin?" (Minutes, hours or days)      8/25 approx. 10:00 AM  4. PATTERN "Does the pain come and go, or has it been constant since it started?"  "Does it get worse with exertion?"     Constant - varies in intensity 5. DURATION: "How long does it last" (e.g., seconds, minutes, hours)     constant 6. SEVERITY: "How bad is the pain?"  (e.g., Scale 1-10; mild, moderate, or severe)    - MILD (1-3): doesn't interfere with normal activities     - MODERATE  (4-7): interferes with normal activities or awakens from sleep    - SEVERE (8-10): excruciating pain, unable to do any normal activities      1/10 7. CARDIAC RISK FACTORS: "Do you have any history of heart problems or risk factors for heart disease?" (e.g., angina, prior heart attack; diabetes, high blood pressure, high cholesterol, smoker, or strong family history of heart disease)     HTN, elev. Cholesterol; denied smoking or DM; mother had MI at 56 8. PULMONARY RISK FACTORS: "Do you have any history of lung disease?"  (e.g., blood clots in lung, asthma, emphysema, birth control pills)     Denied hx of PE, asthma or COPD  9. CAUSE: "What do you think is causing the chest pain?"    Unknown 10. OTHER SYMPTOMS: "Do you have any other symptoms?" (e.g., dizziness, nausea, vomiting, sweating, fever, difficulty breathing, cough)      Elevated BP; denied SOB, sweating, nausea, or heart palpitations  11. PREGNANCY: "Is there any chance you are pregnant?" "When was your last menstrual period?"       N/a  Protocols used: CHEST PAIN-A-AH

## 2019-01-12 NOTE — Telephone Encounter (Signed)
Dr. Yong Channel agrees

## 2019-01-12 NOTE — ED Provider Notes (Signed)
[place] DEPT Provider Note   CSN: [id number] Arrival date & time: 01/12/19  1311     History   Chief Complaint Chief Complaint  Patient presents with  . Chest Pain    HPI Madison Blair is a 73 y.o. female history of hyperlipidemia, peptic ulcer disease here presenting with chest pain, hypertension.  Patient saw her doctor for a physical about a week ago was told that her triglycerides are slightly elevated.  She was started on the Lipitor.  She was also noted to have slightly elevated blood pressure around 140s.  Patient states that for the last 2 to 3 days, she noticed her blood pressure is elevated between 140-160. She had some right-sided chest pain yesterday that radiated to her back that lasted several seconds.  Denies any associated shortness of breath.  She check her blood pressure at that time and was about 160.  Patient is not currently on any blood pressure medicines.     The history is provided by the patient.    Past Medical History:  Diagnosis Date  . Hyperlipidemia   . PUD (peptic ulcer disease)    x6    Patient Active Problem List   Diagnosis Date Noted  . 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 History of Mohs micrographic surgery for skin cancer 03/24/2018  . Vitamin D deficiency 12/30/2016  . Osteopenia 12/30/2016  . PVC's (premature ventricular contractions) 01/16/2016  . Insomnia 06/11/2015  . Mitral valve prolapse 06/11/2015  . [place] arthritis 06/11/2015  . Former smoker 06/11/2015  . Positive hepatitis C antibody test 06/29/2013  . GERD (gastroesophageal reflux disease) 06/29/2013  . Chest pain 08/01/2011  . Hyperlipidemia 08/01/2011    Past Surgical History:  Procedure Laterality Date  . ABDOMINAL HYSTERECTOMY     TAAH, BSO- removed cervix. bladder tack that failed  . APPENDECTOMY    . Bladder tack     failed  . TONSILLECTOMY       OB History   No obstetric history on file.      Home Medications     Prior to Admission medications   Medication Sig Start Date End Date Taking? Authorizing Provider  amLODipine (NORVASC) 2.5 MG tablet Take 1 tablet (2.5 mg total) by mouth daily. 01/12/19   Drenda Freeze, MD  aspirin 325 MG tablet Take 325 mg by mouth daily.    [provider]  atorvastatin (LIPITOR) 20 MG tablet Take 1 tablet by mouth once weekly. 01/04/19   Marin Olp, MD  estradiol (ESTRACE) 1 MG tablet Take 1.5 mg by mouth daily.     [provider]  Multiple Vitamins-Minerals (CENTRUM SILVER PO) Take by mouth.    [provider]  pantoprazole (PROTONIX) 40 MG tablet TAKE 1 TABLET BY MOUTH ONCE DAILY IN THE MORNING 12/06/18   [provider]    Family History Family History  Problem Relation Age of Onset  . Heart disease Mother        MVR; MI 53  . Breast cancer Mother        in 37s  . Heart disease Father        Pacemaker    Social History Social History   Tobacco Use  . Smoking status: Former Smoker    Packs/day: 1.00    Years: 30.00    Pack years: 30.00    Types: Cigarettes    Quit date: 05/19/1990    Years since quitting: 28.6  . Smokeless tobacco:  Never Used  Substance Use Topics  . Alcohol use: Yes    Alcohol/week: 0.0 standard drinks    Comment: Rarely  . Drug use: No     Allergies   Patient has no known allergies.   Review of Systems Review of Systems  Cardiovascular: Positive for chest pain.  All other systems reviewed and are negative.    Physical Exam Updated Vital Signs BP (!) 155/67 (BP Location: Left Arm)   Pulse 74   Temp 98.7 F (37.1 C) (Oral)   Resp 14   Ht 5\' 5"  (1.651 m)   Wt 56.7 kg   SpO2 96%   BMI 20.80 kg/m   Physical Exam Vitals signs and nursing note reviewed.  Constitutional:      Appearance: She is well-developed.  HENT:     Head: Normocephalic.  Eyes:     Extraocular Movements: Extraocular movements intact.     Pupils: Pupils are equal, round, and reactive to light.   Neck:     Musculoskeletal: Normal range of motion and neck supple.  Cardiovascular:     Rate and Rhythm: Normal rate and regular rhythm.     Heart sounds: Normal heart sounds.  Pulmonary:     Effort: Pulmonary effort is normal.     Breath sounds: Normal breath sounds.  Chest:     Chest wall: No deformity or tenderness.  Abdominal:     General: Bowel sounds are normal.     Palpations: Abdomen is soft.  Musculoskeletal: Normal range of motion.  Skin:    General: Skin is warm.     Capillary Refill: Capillary refill takes less than 2 seconds.  Neurological:     General: No focal deficit present.     Mental Status: She is alert and oriented to person, place, and time.  Psychiatric:        Mood and Affect: Mood normal.        Behavior: Behavior normal.      ED Treatments / Results  Labs (all labs ordered are listed, but only abnormal results are displayed) Labs Reviewed  BASIC METABOLIC PANEL - Abnormal; Notable for the following components:      Result Value   Glucose, Bld 120 (*)    All other components within normal limits  CBC  TROPONIN I (HIGH SENSITIVITY)    EKG EKG Interpretation  Date/Time:  Wednesday January 12 2019 13:23:28 EDT Ventricular Rate:  77 PR Interval:    QRS Duration: 92 QT Interval:  409 QTC Calculation: 463 R Axis:   80 Text Interpretation:  Sinus rhythm Confirmed by Quintella Reichert [phone]) on 01/12/2019 1:28:23 PM   Radiology Dg Chest 2 View  Result Date: 01/12/2019 CLINICAL DATA:  Chest pain EXAM: CHEST - 2 VIEW COMPARISON:  05/17/2008 FINDINGS: The heart size and mediastinal contours are within normal limits. Both lungs are clear. The visualized skeletal structures are unremarkable. IMPRESSION: No active cardiopulmonary disease. Electronically Signed   By: Kathreen Devoid   On: 01/12/2019 15:02    Procedures Procedures (including critical care time)  Medications Ordered in ED Medications  sodium chloride flush (NS) 0.9 % injection 3 mL  (has no administration in time range)  amLODipine (NORVASC) tablet 2.5 mg (has no administration in time range)     Initial Impression / Assessment and Plan / ED Course  I have reviewed the triage vital signs and the nursing notes.  Pertinent labs & imaging results that were available during my care of the patient were  reviewed by me and considered in my medical decision making (see chart for details).       Madison Blair is a 73 y.o. female here with chest pain. Chest pain has been 24 hours, pain free currently. Low risk for ACS and symptoms for 24 hours so 1 set of troponin sufficient. I doubt dissection or PE.  I think it is likely symptomatic hypertension. BP 140-150s in the ED. Will get labs, trop x 1, CXR.   3:48 PM Labs and EKG unremarkable. Started on norvasc 2.5 mg daily. Will have her monitor her BP at home and follow up with PCP.    Final Clinical Impressions(s) / ED Diagnoses   Final diagnoses:  Other chest pain  Essential hypertension    ED Discharge Orders         Ordered    amLODipine (NORVASC) 2.5 MG tablet  Daily     01/12/19 1538           Drenda Freeze, MD 01/12/19 [phone]

## 2019-01-12 NOTE — Telephone Encounter (Signed)
Spoke with triage nurse, she will continue in her efforts to reach pt and advise to go to the ED per Dr. Ansel Bong request. She will reach out to me if she is unable to reach patient.

## 2019-01-12 NOTE — Telephone Encounter (Signed)
Attempted a 3rd time to call pt. Back; unable to reach her.  Have left vm on 2 previous calls to return call to [phone]; advised on vm that Dr. Yong Channel is recommending pt. go to the ER.

## 2019-01-12 NOTE — ED Triage Notes (Signed)
Pt states she had a BP of 160/90 x2. Pt states yesterday, she had sharp pain in her chest to her back. Pt states she took ASA at that time, and the pain decreased. Pt reports decreased energy and elevated BP. Pt states sharp pain in right breast. Pt reports mild headache that is relieved by ibuprofen.

## 2019-01-12 NOTE — Telephone Encounter (Signed)
Appears patient in the emergency room thankfully

## 2019-01-12 NOTE — Telephone Encounter (Signed)
Called pt and left VM to call the office. Also sent MyChart message advising that Dr. Yong Channel is advising that she go to the ED for evaluation.

## 2019-01-13 ENCOUNTER — Encounter: Payer: Self-pay | Admitting: Family Medicine

## 2019-01-17 ENCOUNTER — Ambulatory Visit: Payer: PPO | Admitting: Physician Assistant

## 2019-01-19 ENCOUNTER — Encounter: Payer: Self-pay | Admitting: Family Medicine

## 2019-01-19 ENCOUNTER — Other Ambulatory Visit: Payer: Self-pay

## 2019-01-19 ENCOUNTER — Ambulatory Visit (INDEPENDENT_AMBULATORY_CARE_PROVIDER_SITE_OTHER): Payer: PPO | Admitting: Family Medicine

## 2019-01-19 ENCOUNTER — Ambulatory Visit
Admission: RE | Admit: 2019-01-19 | Discharge: 2019-01-19 | Disposition: A | Discharge status: Home / Self Care | Payer: PPO | Source: Ambulatory Visit | Attending: Obstetrics & Gynecology | Admitting: Obstetrics & Gynecology

## 2019-01-19 VITALS — BP 140/98 | HR 66 | Temp 98.3°F | Ht 65.0 in | Wt 124.0 lb

## 2019-01-19 DIAGNOSIS — Z780 Asymptomatic menopausal state: Secondary | ICD-10-CM | POA: Diagnosis not present

## 2019-01-19 DIAGNOSIS — E2839 Other primary ovarian failure: Secondary | ICD-10-CM

## 2019-01-19 DIAGNOSIS — R079 Chest pain, unspecified: Secondary | ICD-10-CM | POA: Diagnosis not present

## 2019-01-19 DIAGNOSIS — E785 Hyperlipidemia, unspecified: Secondary | ICD-10-CM | POA: Diagnosis not present

## 2019-01-19 DIAGNOSIS — M8589 Other specified disorders of bone density and structure, multiple sites: Secondary | ICD-10-CM | POA: Diagnosis not present

## 2019-01-19 DIAGNOSIS — I10 Essential (primary) hypertension: Secondary | ICD-10-CM | POA: Diagnosis not present

## 2019-01-19 LAB — HM DEXA SCAN

## 2019-01-19 MED ORDER — AMLODIPINE BESYLATE 5 MG PO TABS: 5.0000 mg | ORAL_TABLET | Freq: Every day | ORAL | 5 refills | Status: DC

## 2019-01-19 NOTE — Patient Instructions (Addendum)
Health Maintenance Due  Topic Date Due  . INFLUENZA VACCINE Pt declined  12/18/2018   Increase amlodipine to 5 mg. Follow up with Dr. Gwenlyn Found 9/23 and I will look out for how BP is doing at that time  If he thinks from cardiac perspective you can return to the [facility] I will write letter  Try to keep diet low salt  Perhaps we should check in 6-8 weeks to make sure blood pressure staying in better spot.    DASH Eating Plan DASH stands for "Dietary Approaches to Stop Hypertension." The DASH eating plan is a healthy eating plan that has been shown to reduce high blood pressure (hypertension). It may also reduce your risk for type 2 diabetes, heart disease, and stroke. The DASH eating plan may also help with weight loss. What are tips for following this plan?  General guidelines  Avoid eating more than 2,300 mg (milligrams) of salt (sodium) a day. If you have hypertension, you may need to reduce your sodium intake to 1,500 mg a day.  Limit alcohol intake to no more than 1 drink a day for nonpregnant women and 2 drinks a day for men. One drink equals 12 oz of beer, 5 oz of wine, or 1 oz of hard liquor.  Work with your health care provider to maintain a healthy body weight or to lose weight. Ask what an ideal weight is for you.  Get at least 30 minutes of exercise that causes your heart to beat faster (aerobic exercise) most days of the week. Activities may include walking, swimming, or biking.  Work with your health care provider or diet and nutrition specialist (dietitian) to adjust your eating plan to your individual calorie needs. Reading food labels   Check food labels for the amount of sodium per serving. Choose foods with less than 5 percent of the Daily Value of sodium. Generally, foods with less than 300 mg of sodium per serving fit into this eating plan.  To find whole grains, look for the word "whole" as the first word in the ingredient list. Shopping  Buy products labeled as  "low-sodium" or "no salt added."  Buy fresh foods. Avoid canned foods and premade or frozen meals. Cooking  Avoid adding salt when cooking. Use salt-free seasonings or herbs instead of table salt or sea salt. Check with your health care provider or pharmacist before using salt substitutes.  Do not fry foods. Cook foods using healthy methods such as baking, boiling, grilling, and broiling instead.  Cook with heart-healthy oils, such as olive, canola, soybean, or sunflower oil. Meal planning  Eat a balanced diet that includes: ? 5 or more servings of fruits and vegetables each day. At each meal, try to fill half of your plate with fruits and vegetables. ? Up to 6-8 servings of whole grains each day. ? Less than 6 oz of lean meat, poultry, or fish each day. A 3-oz serving of meat is about the same size as a deck of cards. One egg equals 1 oz. ? 2 servings of low-fat dairy each day. ? A serving of nuts, seeds, or beans 5 times each week. ? Heart-healthy fats. Healthy fats called Omega-3 fatty acids are found in foods such as flaxseeds and coldwater fish, like sardines, salmon, and mackerel.  Limit how much you eat of the following: ? Canned or prepackaged foods. ? Food that is high in trans fat, such as fried foods. ? Food that is high in saturated fat, such as  fatty meat. ? Sweets, desserts, sugary drinks, and other foods with added sugar. ? Full-fat dairy products.  Do not salt foods before eating.  Try to eat at least 2 vegetarian meals each week.  Eat more home-cooked food and less restaurant, buffet, and fast food.  When eating at a restaurant, ask that your food be prepared with less salt or no salt, if possible. What foods are recommended? The items listed may not be a complete list. Talk with your dietitian about what dietary choices are best for you. Grains Whole-grain or whole-wheat bread. Whole-grain or whole-wheat pasta. Brown rice. [place]. Bulgur. Whole-grain  and low-sodium cereals. Pita bread. Low-fat, low-sodium crackers. Whole-wheat flour tortillas. Vegetables Fresh or frozen vegetables (raw, steamed, roasted, or grilled). Low-sodium or reduced-sodium tomato and vegetable juice. Low-sodium or reduced-sodium tomato sauce and tomato paste. Low-sodium or reduced-sodium canned vegetables. Fruits All fresh, dried, or frozen fruit. Canned fruit in natural juice (without added sugar). Meat and other protein foods Skinless chicken or [place]. Ground chicken or [place]. Pork with fat trimmed off. Fish and seafood. Egg whites. Dried beans, peas, or lentils. Unsalted nuts, nut butters, and seeds. Unsalted canned beans. Lean cuts of beef with fat trimmed off. Low-sodium, lean deli meat. Dairy Low-fat (1%) or fat-free (skim) milk. Fat-free, low-fat, or reduced-fat cheeses. Nonfat, low-sodium ricotta or cottage cheese. Low-fat or nonfat yogurt. Low-fat, low-sodium cheese. Fats and oils Soft margarine without trans fats. Vegetable oil. Low-fat, reduced-fat, or light mayonnaise and salad dressings (reduced-sodium). Canola, safflower, olive, soybean, and sunflower oils. Avocado. Seasoning and other foods Herbs. Spices. Seasoning mixes without salt. Unsalted popcorn and pretzels. Fat-free sweets. What foods are not recommended? The items listed may not be a complete list. Talk with your dietitian about what dietary choices are best for you. Grains Baked goods made with fat, such as croissants, muffins, or some breads. Dry pasta or rice meal packs. Vegetables Creamed or fried vegetables. Vegetables in a cheese sauce. Regular canned vegetables (not low-sodium or reduced-sodium). Regular canned tomato sauce and paste (not low-sodium or reduced-sodium). Regular tomato and vegetable juice (not low-sodium or reduced-sodium). Angie Fava. Olives. Fruits Canned fruit in a light or heavy syrup. Fried fruit. Fruit in cream or butter sauce. Meat and other protein foods Fatty cuts  of meat. Ribs. Fried meat. Berniece Salines. Sausage. Bologna and other processed lunch meats. Salami. Fatback. Hotdogs. Bratwurst. Salted nuts and seeds. Canned beans with added salt. Canned or smoked fish. Whole eggs or egg yolks. Chicken or [place] with skin. Dairy Whole or 2% milk, cream, and half-and-half. Whole or full-fat cream cheese. Whole-fat or sweetened yogurt. Full-fat cheese. Nondairy creamers. Whipped toppings. Processed cheese and cheese spreads. Fats and oils Butter. Stick margarine. Lard. Shortening. Ghee. Bacon fat. Tropical oils, such as coconut, palm kernel, or palm oil. Seasoning and other foods Salted popcorn and pretzels. Onion salt, garlic salt, seasoned salt, table salt, and sea salt. Worcestershire sauce. Tartar sauce. Barbecue sauce. Teriyaki sauce. Soy sauce, including reduced-sodium. Steak sauce. Canned and packaged gravies. Fish sauce. Oyster sauce. Cocktail sauce. Horseradish that you find on the shelf. Ketchup. Mustard. Meat flavorings and tenderizers. Bouillon cubes. Hot sauce and Tabasco sauce. Premade or packaged marinades. Premade or packaged taco seasonings. Relishes. Regular salad dressings. Where to find more information:  National Heart, Lung, and [place]: https://wilson-eaton.com/  American Heart Association: www.heart.org Summary  The DASH eating plan is a healthy eating plan that has been shown to reduce high blood pressure (hypertension). It may also reduce your risk for  type 2 diabetes, heart disease, and stroke.  With the DASH eating plan, you should limit salt (sodium) intake to 2,300 mg a day. If you have hypertension, you may need to reduce your sodium intake to 1,500 mg a day.  When on the DASH eating plan, aim to eat more fresh fruits and vegetables, whole grains, lean proteins, low-fat dairy, and heart-healthy fats.  Work with your health care provider or diet and nutrition specialist (dietitian) to adjust your eating plan to your individual calorie  needs. This information is not intended to replace advice given to you by your health care provider. Make sure you discuss any questions you have with your health care provider. Document Released: 04/24/2011 Document Revised: 04/17/2017 Document Reviewed: 04/28/2016 Elsevier Patient Education  2020 [employer].

## 2019-01-19 NOTE — Progress Notes (Signed)
Phone [phone]   Subjective:  Madison Blair is a 73 y.o. year old very pleasant female patient who presents for/with See problem oriented charting Chief Complaint  Patient presents with  . Hospitalization Follow-up    Bp f/u    ROS-no recurrent chest pain since ER visit.  No fever/chills/cough/congestion reported.  No shortness of breath.  Past Medical History-  Patient Active Problem List   Diagnosis Date Noted  . Vitamin D deficiency 12/30/2016    Priority: Medium  . PVC's (premature ventricular contractions) 01/16/2016    Priority: Medium  . Positive hepatitis C antibody test 06/29/2013    Priority: Medium  . Hyperlipidemia 08/01/2011    Priority: Medium  . Insomnia 06/11/2015    Priority: Low  . Mitral valve prolapse 06/11/2015    Priority: Low  . 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 Former smoker 06/11/2015    Priority: Low  . GERD (gastroesophageal reflux disease) 06/29/2013    Priority: Low  . Chest pain 08/01/2011    Priority: Low  . Senile purpura ([place]) 01/03/2019  . History of Mohs micrographic surgery for skin cancer 03/24/2018  . Osteopenia 12/30/2016    Medications- reviewed and updated Current Outpatient Medications  Medication Sig Dispense Refill  . amLODipine (NORVASC) 5 MG tablet Take 1 tablet (5 mg total) by mouth daily. 30 tablet 5  . aspirin 325 MG tablet Take 325 mg by mouth daily.    Marland Kitchen atorvastatin (LIPITOR) 20 MG tablet Take 1 tablet by mouth once weekly. 13 tablet 3  . estradiol (ESTRACE) 1 MG tablet Take 1.5 mg by mouth daily.     . Multiple Vitamins-Minerals (CENTRUM SILVER PO) Take by mouth.    . pantoprazole (PROTONIX) 40 MG tablet TAKE 1 TABLET BY MOUTH ONCE DAILY IN THE MORNING    . valACYclovir (VALTREX) 1000 MG tablet      No current facility-administered medications for this visit.      Objective:  BP (!) 140/98   Pulse 66   Temp 98.3 F (36.8 C) (Other (Comment))   Ht 5\' 5"  (1.651 m)   Wt 124 lb (56.2 kg)    SpO2 97%   BMI 20.63 kg/m  Gen: NAD, resting comfortably CV: RRR no murmurs rubs or gallops No chest wall pain today Lungs: CTAB no crackles, wheeze, rhonchi Abdomen: soft/nontender/nondistended/normal bowel sounds.  Ext: no edema Skin: warm, dry Neuro: grossly normal, moves all extremities    Assessment and Plan   #Hypertension/atypical chest pain S: On 01/10/2018-patient experienced bilateral breast and into back very sharp pain while reaching into her closet- denies recent exertion prior to this.Evelina Bucy a few aspirins and went away within a few minutes. Pressure up to 160/90 at that time. The next day called [facility] cardiology but she was unable to get in at that time.  She states she continued to "not feel right".  She called [place] and due to need to rule out myocardial infarction we directed to the emergency room so she can have EKG and troponin-fortunately troponin was not elevated and EKG largely reassuring.  ER provider suspected symptomatic hypertension.  Patient began to feel better in the emergency room-she was discharged on amlodipine 2.5 mg.Fatigue is really only symptom that she currently has.  She does admit to higher stress-Neighbor across hall just home [place]- CHF and morbid obesity- has been doing a lot of caretaking. Hard to wait on her- sons not helping.   Patient would like to return  to regular exercise-she has an upcoming cardiology visit in about 3 weeks-I recommended we get blood pressure down and get cardiology clearance first before pushing it at the gym-she would need a letter to be released for the gym.  She does have hyperlipidemia recently started on once weekly statin A/P: 73 year old female with hyperlipidemia, new onset hypertension, history of peptic ulcer disease on PPI high-dose presented to the emergency room with atypical chest pain- myocardial infarction was ruled out.  Emergency room physician thought this was symptomatic hypertension.  Given blood  pressure has remained high on home checks up to 160/90 and chest pain has not recurred I suspect there may have been another cause that symptomatic hypertension.  Doubt GERD with high-dose PPI.  Doubt cardiac etiology but has cardiology follow-up for further evaluation.  Could have been gaseous distention or esophageal spasm. - I do want her to return to the gym but want to wait until we get cardiology's opinion. -She asks about returning to dance-I asked her to go at about 50% of her normal and stop immediately if she experiences any recurrent symptoms. - We recently completed labs for physical on 817 and labs were reassuring in emergency room as well- I did not think we needed to repeat at this time -For hyperlipidemia-continue statin.  Patient already takes aspirin at baseline for primary prevention  #hypertension S: Poorly controlled on amlodipine 2.5 mg. 168/90 on recheck by me today.   Prior to meds, digital cuff running 140-150, if she would check was getting 160/90.  BP Readings from Last 3 Encounters:  01/19/19 (!) 140/98  01/12/19 (!) 147/66  01/03/19 132/82  A/P: Poor control- increase amlodipine to 5 mg Message me in 10 days- consider amlodipine 10mg   Also sees cardiology in about 3 weeks- we will plan to look at blood pressure at that time  Recommended follow up: Our team will cancel 2021 visit-accidentally scheduled Future Appointments  Date Time Provider [place]  02/09/2019  9:00 AM Lorretta Harp, MD [facility]  03/08/2019 11:20 AM Marin Olp, MD [facility]  01/05/2020  8:20 AM Marin Olp, MD [facility]   Lab/Order associations:   ICD-10-CM   1. Chest pain, unspecified type  R07.9   2. Essential hypertension  I10   3. Hyperlipidemia, unspecified hyperlipidemia type  E78.5     Meds ordered this encounter  Medications  . amLODipine (NORVASC) 5 MG tablet    Sig: Take 1 tablet (5 mg total) by mouth daily.    Dispense:  30 tablet     Refill:  5    Return precautions advised.  Garret Reddish, MD

## 2019-01-25 DIAGNOSIS — Z01419 Encounter for gynecological examination (general) (routine) without abnormal findings: Secondary | ICD-10-CM | POA: Diagnosis not present

## 2019-01-25 DIAGNOSIS — Z6820 Body mass index (BMI) 20.0-20.9, adult: Secondary | ICD-10-CM | POA: Diagnosis not present

## 2019-01-25 DIAGNOSIS — Z1231 Encounter for screening mammogram for malignant neoplasm of breast: Secondary | ICD-10-CM | POA: Diagnosis not present

## 2019-02-09 ENCOUNTER — Telehealth: Payer: Self-pay

## 2019-02-09 ENCOUNTER — Ambulatory Visit: Payer: PPO | Admitting: Cardiovascular Disease

## 2019-02-09 ENCOUNTER — Other Ambulatory Visit: Payer: Self-pay

## 2019-02-09 ENCOUNTER — Encounter: Payer: Self-pay | Admitting: Cardiovascular Disease

## 2019-02-09 DIAGNOSIS — E782 Mixed hyperlipidemia: Secondary | ICD-10-CM

## 2019-02-09 DIAGNOSIS — R079 Chest pain, unspecified: Secondary | ICD-10-CM | POA: Diagnosis not present

## 2019-02-09 MED ORDER — ATORVASTATIN CALCIUM 20 MG PO TABS: 20.0000 mg | ORAL_TABLET | Freq: Every day | ORAL | 3 refills | Status: DC

## 2019-02-09 NOTE — Assessment & Plan Note (Signed)
Recent ER evaluation for atypical chest pain.  Her enzymes were negative.  It sounds musculoskeletal to me.  Her only risk factor is mild hyperlipidemia.  Am going get a coronary calcium score to further evaluate

## 2019-02-09 NOTE — Assessment & Plan Note (Signed)
History of hyperlipidemia only on atorvastatin once a week with lipid profile performed 01/03/2019 revealing total cholesterol 232, LDL 135 and HDL 67.  I am going to increase this to atorvastatin daily and will recheck in 3 months

## 2019-02-09 NOTE — Telephone Encounter (Signed)
02/09/2019 AVS and lab slip mailed to pt address on 02/09/2019

## 2019-02-09 NOTE — Progress Notes (Signed)
02/09/2019 Madison Blair   August 26, 1945  [id number]  Primary Physician Yong Channel Brayton Mars, MD Primary Cardiologist: Lorretta Harp MD Lupe Carney, [place]  HPI:  Madison Blair is a 73 y.o.  divorced Caucasian female mother of 2 children, grandmother of 2 grandchildren referred to Dr. Yong Channel, her primary care physician, for evaluation of dyspnea and bradycardia. She is retired Midwife at [employer].  I last saw her in the office 01/16/2016.  Risk factors are mild hyperlipidemia but otherwise she is a healthy woman without significant comorbidities. She does exercise 3 days a week at the [facility]. Her father did have a permanent transvenous pacemaker for bradycardia. She denies chest pain or shortness of breath although recently while on the elliptical she was somewhat dysmetric and noted herself that she was bradycardic. An EKG performed by Dr. Yong Channel showed sinus rhythm at 60 with one PVC. I suspect her bradycardia was non palpated PVC.  She was seen in the emergency room 01/12/2019 with atypical chest pain.  She ruled out for myocardial infarction by enzymes.  KG was essentially normal.  She was hypertensive and was begun on amlodipine now with controlled hypertension.  She is had no recurrent chest pain.   Current Meds  Medication Sig  . amLODipine (NORVASC) 5 MG tablet Take 1 tablet (5 mg total) by mouth daily.  Marland Kitchen aspirin 325 MG tablet Take 325 mg by mouth daily.  Marland Kitchen atorvastatin (LIPITOR) 20 MG tablet Take 1 tablet by mouth once weekly.  Marland Kitchen estradiol (ESTRACE) 1 MG tablet Take 1.5 mg by mouth daily.   . Multiple Vitamins-Minerals (CENTRUM SILVER PO) Take by mouth.  . pantoprazole (PROTONIX) 40 MG tablet TAKE 1 TABLET BY MOUTH ONCE DAILY IN THE MORNING     No Known Allergies  Social History   Socioeconomic History  . Marital status: Divorced    Spouse name: Not on file  . Number of children: 2   . Years of education: Not on file  . Highest education level: Not  on file  Occupational History    Comment: Retired  Scientific laboratory technician  . Financial resource strain: Not on file  . Food insecurity    Worry: Not on file    Inability: Not on file  . Transportation needs    Medical: Not on file    Non-medical: Not on file  Tobacco Use  . Smoking status: Former Smoker    Packs/day: 1.00    Years: 30.00    Pack years: 30.00    Types: Cigarettes    Quit date: 05/19/1990    Years since quitting: 28.7  . Smokeless tobacco: Never Used  Substance and Sexual Activity  . Alcohol use: Yes    Alcohol/week: 0.0 standard drinks    Comment: Rarely  . Drug use: No  . Sexual activity: Not on file  Lifestyle  . Physical activity    Days per week: Not on file    Minutes per session: Not on file  . Stress: Not on file  Relationships  . Social Herbalist on phone: Not on file    Gets together: Not on file    Attends religious service: Not on file    Active member of club or organization: Not on file    Attends meetings of clubs or organizations: Not on file    Relationship status: Not on file  . Intimate partner violence    Fear of current or ex  partner: Not on file    Emotionally abused: Not on file    Physically abused: Not on file    Forced sexual activity: Not on file  Other Topics Concern  . Not on file  Social History Narrative   Divorced. 2 children (daughter 34, son 26 in 2017). 2 grandchildren from daughter 2850 month old [name], mckenzie 1 month in 2017).       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  Cardiovascular: negative for chest pain, dyspnea on exertion, edema, orthopnea, palpitations, paroxysmal nocturnal dyspnea or shortness of breath Dermatological: negative for rash Respiratory: negative for cough or wheezing  Urologic: negative for hematuria Abdominal: negative for nausea, vomiting, diarrhea, bright red blood per rectum, melena, or hematemesis Neurologic: negative for visual changes, syncope, or dizziness All other systems reviewed and are otherwise negative except as noted above.    Blood pressure (!) 145/61, pulse 73, height 5\' 4"  (1.626 m), weight 123 lb (55.8 kg), SpO2 99 %.  General appearance: alert and no distress Neck: no adenopathy, no carotid bruit, no JVD, supple, symmetrical, trachea midline and thyroid not enlarged, symmetric, no tenderness/mass/nodules Lungs: clear to auscultation bilaterally Heart: regular rate and rhythm, S1, S2 normal, no murmur, click, rub or gallop Extremities: extremities normal, atraumatic, no cyanosis or edema Pulses: 2+ and symmetric Skin: Skin color, texture, turgor normal. No rashes or lesions Neurologic: Alert and oriented X 3, normal strength and tone. Normal symmetric reflexes. Normal coordination and gait  EKG not performed today  ASSESSMENT AND PLAN:   Chest pain Recent ER evaluation for atypical chest pain.  Her enzymes were negative.  It sounds musculoskeletal to me.  Her only risk factor is mild hyperlipidemia.  Am going get a coronary calcium score to further evaluate  Hyperlipidemia History of hyperlipidemia only on atorvastatin once a week with lipid profile performed 01/03/2019 revealing total cholesterol 232, LDL 135 and HDL 67.  I am going to increase this to atorvastatin daily and will recheck in 3 months      Lorretta Harp MD [facility] [place] [facility], [facility] 02/09/2019 9:57 AM

## 2019-02-09 NOTE — Patient Instructions (Addendum)
Medication Instructions:  Your physician has recommended you make the following change in your medication:   INCREASE YOUR ATORVASTATIN (Lipitor) TO 20 MG BY MOUTH DAILY  If you need a refill on your cardiac medications before your next appointment, please call your pharmacy.   Lab work: Your physician recommends that you return for lab work in 3 months: Katie  If you have labs (blood work) drawn today and your tests are completely normal, you will receive your results only by: Marland Kitchen MyChart Message (if you have MyChart) OR . A paper copy in the mail If you have any lab test that is abnormal or we need to change your treatment, we will call you to review the results.  Testing/Procedures: Your physician has recommended that you have a coronary calcium scan. This will cost $150 out-of-pocket. You will be contacted by our office to schedule this appointment.  Coronary Calcium Scan A coronary calcium scan is an imaging test used to look for deposits of calcium and other fatty materials (plaques) in the inner lining of the blood vessels of the heart (coronary arteries). These deposits of calcium and plaques can partly clog and narrow the coronary arteries without producing any symptoms or warning signs. This puts a person at risk for a heart attack. This test can detect these deposits before symptoms develop. Tell a health care provider about:  Any allergies you have.  All medicines you are taking, including vitamins, herbs, eye drops, creams, and over-the-counter medicines.  Any problems you or family members have had with anesthetic medicines.  Any blood disorders you have.  Any surgeries you have had.  Any medical conditions you have.  Whether you are pregnant or may be pregnant. What are the risks? Generally, this is a safe procedure. However, problems may occur, including:  Harm to a pregnant woman and her unborn baby. This test involves the use of radiation.  Radiation exposure can be dangerous to a pregnant woman and her unborn baby. If you are pregnant, you generally should not have this procedure done.  Slight increase in the risk of cancer. This is because of the radiation involved in the test. What happens before the procedure? No preparation is needed for this procedure. What happens during the procedure?   You will undress and remove any jewelry around your neck or chest.  You will put on a hospital gown.  Sticky electrodes will be placed on your chest. The electrodes will be connected to an electrocardiogram (ECG) machine to record a tracing of the electrical activity of your heart.  A CT scanner will take pictures of your heart. During this time, you will be asked to lie still and hold your breath for 2-3 seconds while a picture of your heart is being taken. The procedure may vary among health care providers and hospitals. What happens after the procedure?  You can get dressed.  You can return to your normal activities.  It is up to you to get the results of your test. Ask your health care provider, or the department that is doing the test, when your results will be ready. Summary  A coronary calcium scan is an imaging test used to look for deposits of calcium and other fatty materials (plaques) in the inner lining of the blood vessels of the heart (coronary arteries).  Generally, this is a safe procedure. Tell your health care provider if you are pregnant or may be pregnant.  No preparation is needed for  this procedure.  A CT scanner will take pictures of your heart.  You can return to your normal activities after the scan is done. This information is not intended to replace advice given to you by your health care provider. Make sure you discuss any questions you have with your health care provider.  Follow-Up: At [facility], you and your health needs are our priority.  As part of our continuing mission to provide you  with exceptional heart care, we have created designated Provider Care Teams.  These Care Teams include your primary Cardiologist (physician) and Advanced Practice Providers (APPs -  Physician Assistants and Nurse Practitioners) who all work together to provide you with the care you need, when you need it. You will need a follow up appointment in 6 months with Dr. Quay Burow.  Please call our office 2 months in advance to schedule this appointment.

## 2019-02-09 NOTE — Addendum Note (Signed)
Addended by: Annita Brod on: 02/09/2019 11:01 AM   Modules accepted: Orders

## 2019-02-14 ENCOUNTER — Encounter: Payer: Self-pay | Admitting: Family Medicine

## 2019-03-01 NOTE — Patient Instructions (Addendum)
Health Maintenance Due  Topic Date Due  . COLONOSCOPY -will call Dr. Amedeo Plenty ([facility]) 02/27/2019   Schedule lab visit in 5-6 weeks at front desk before you leave today  3 month blood pressure check- if continues to do well may space to 6 months

## 2019-03-01 NOTE — Progress Notes (Signed)
Phone [phone]   Subjective:  Madison Blair is a 73 y.o. year old very pleasant female patient who presents for/with See problem oriented charting Chief Complaint  Patient presents with  . Follow-up  . Hypertension  . Hyperlipidemia   ROS- No chest pain or shortness of breath.  No fever chills reported.  Past Medical History-  Patient Active Problem List   Diagnosis Date Noted  . Vitamin D deficiency 12/30/2016    Priority: Medium  . PVC's (premature ventricular contractions) 01/16/2016    Priority: Medium  . Positive hepatitis C antibody test 06/29/2013    Priority: Medium  . Hyperlipidemia 08/01/2011    Priority: Medium  . Insomnia 06/11/2015    Priority: Low  . Mitral valve prolapse 06/11/2015    Priority: Low  . 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 Former smoker 06/11/2015    Priority: Low  . GERD (gastroesophageal reflux disease) 06/29/2013    Priority: Low  . Chest pain 08/01/2011    Priority: Low  . 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 History of Mohs micrographic surgery for skin cancer 03/24/2018  . Osteopenia 12/30/2016    Medications- reviewed and updated Current Outpatient Medications  Medication Sig Dispense Refill  . amLODipine (NORVASC) 5 MG tablet Take 1 tablet (5 mg total) by mouth daily. 30 tablet 5  . aspirin 325 MG tablet Take 325 mg by mouth daily.    Marland Kitchen atorvastatin (LIPITOR) 20 MG tablet Take 1 tablet (20 mg total) by mouth daily. 90 tablet 3  . estradiol (ESTRACE) 1 MG tablet Take 1.5 mg by mouth daily.     . Multiple Vitamins-Minerals (CENTRUM SILVER PO) Take by mouth.    . pantoprazole (PROTONIX) 40 MG tablet TAKE 1 TABLET BY MOUTH ONCE DAILY IN THE MORNING    . valACYclovir (VALTREX) 1000 MG tablet Take 2 pills twice a day for 1 day at first sign of cold sore 30 tablet 1   No current facility-administered medications for this visit.      Objective:  BP 136/84   Pulse 61   Temp 98.3 F (36.8 C)   Ht 5\' 4"  (1.626 m)    Wt 122 lb 3.2 oz (55.4 kg)   SpO2 98%   BMI 20.98 kg/m  Gen: NAD, resting comfortably CV: RRR no murmurs rubs or gallops Lungs: CTAB no crackles, wheeze, rhonchi Abdomen: soft/nontender/nondistended/normal bowel sounds.  Ext: no edema Skin: warm, dry     Assessment and Plan   #Cardiology follow-up-has not had recurrent chest pain-Dr. Gwenlyn Found thought likely musculoskeletal.  Has not scheduled her CT cardiac scoring yet-encouraged her to schedule. Back to dancing-was released by Dr. Corliss Blacker does use her mask when she dances/shags..   # had 7 friends get covid 19 at a large shag festival outside- 3 unfortunately died.  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 Pt denies HA, blurred vision and chest discomfort. Pt states her BP has been running below 140/90- often 120s over 70s-  and her diet and exercise are good. BP Readings from Last 3 Encounters:  03/08/19 136/84  02/09/19 (!) 145/61  01/19/19 (!) 140/98  A/P: Blood pressure improved on amlodipine 5 mg up from 2.5 mg.  Continue current medication.  Home blood pressure is also very well controlled  #hyperlipidemia S: Hopefully controlled on lipitor 20mg -recently increased to daily by cardiology  Lab Results  Component Value Date   CHOL 232 (H) 01/03/2019   HDL 67.30 01/03/2019   LDLCALC 121 (H) 12/31/2017   LDLDIRECT 135.0 01/03/2019   TRIG 202.0 (H) 01/03/2019   CHOLHDL 3 01/03/2019   A/P: Hopefully controlled-repeat lipid panel in 5 to 6 weeks  #Fever blisters S: Patient reports he gets fever blisters intermittently-recently has been about once a year.  Has had good success with Valtrex in the past A/P: No current fever blister but due to history of recurrence-refilled patient's Valtrex for her  Recommended follow up: 3 months blood pressure recheck Future Appointments  Date Time  Provider [place]  06/07/2019 10:40 AM Marin Olp, MD [facility]  01/10/2020 10:40 AM Yong Channel Brayton Mars, MD [facility]   Lab/Order associations:   ICD-10-CM   1. Mixed hyperlipidemia  E78.2 Lipid panel    Comprehensive metabolic panel   Meds ordered this encounter  Medications  . valACYclovir (VALTREX) 1000 MG tablet    Sig: Take 2 pills twice a day for 1 day at first sign of cold sore    Dispense:  30 tablet    Refill:  1   Return precautions advised.  Garret Reddish, MD

## 2019-03-08 ENCOUNTER — Ambulatory Visit (INDEPENDENT_AMBULATORY_CARE_PROVIDER_SITE_OTHER): Payer: PPO | Admitting: Family Medicine

## 2019-03-08 ENCOUNTER — Other Ambulatory Visit: Payer: Self-pay

## 2019-03-08 ENCOUNTER — Encounter: Payer: Self-pay | Admitting: Family Medicine

## 2019-03-08 VITALS — BP 136/84 | HR 61 | Temp 98.3°F | Ht 64.0 in | Wt 122.2 lb

## 2019-03-08 DIAGNOSIS — B001 Herpesviral vesicular dermatitis: Secondary | ICD-10-CM

## 2019-03-08 DIAGNOSIS — E782 Mixed hyperlipidemia: Secondary | ICD-10-CM

## 2019-03-08 MED ORDER — VALACYCLOVIR HCL 1 G PO TABS: ORAL_TABLET | ORAL | 1 refills | Status: DC

## 2019-03-08 NOTE — Assessment & Plan Note (Signed)
S: Patient reports he gets fever blisters intermittently-recently has been about once a year.  Has had good success with Valtrex in the past A/P: No current fever blister but due to history of recurrence-refilled patient's Valtrex for her

## 2019-04-11 ENCOUNTER — Other Ambulatory Visit: Payer: Self-pay

## 2019-04-12 ENCOUNTER — Other Ambulatory Visit (INDEPENDENT_AMBULATORY_CARE_PROVIDER_SITE_OTHER): Payer: PPO

## 2019-04-12 DIAGNOSIS — E782 Mixed hyperlipidemia: Secondary | ICD-10-CM

## 2019-04-12 LAB — COMPREHENSIVE METABOLIC PANEL
ALT: 10 U/L (ref 0–35)
AST: 12 U/L (ref 0–37)
Albumin: 3.4 g/dL — ABNORMAL LOW (ref 3.5–5.2)
Alkaline Phosphatase: 66 U/L (ref 39–117)
BUN: 13 mg/dL (ref 6–23)
CO2: 27 mEq/L (ref 19–32)
Calcium: 8.7 mg/dL (ref 8.4–10.5)
Chloride: 107 mEq/L (ref 96–112)
Creatinine, Ser: 0.64 mg/dL (ref 0.40–1.20)
GFR: 91.47 mL/min (ref >60.00)
Glucose, Bld: 92 mg/dL (ref 70–99)
Potassium: 3.7 mEq/L (ref 3.5–5.1)
Sodium: 140 mEq/L (ref 135–145)
Total Bilirubin: 0.4 mg/dL (ref 0.2–1.2)
Total Protein: 6.1 g/dL (ref 6.0–8.3)

## 2019-04-12 LAB — LIPID PANEL
Cholesterol: 162 mg/dL (ref 0–200)
HDL: 62.1 mg/dL (ref >39.00)
LDL Cholesterol: 67 mg/dL (ref 0–99)
NonHDL: 99.42
Total CHOL/HDL Ratio: 3
Triglycerides: 162 mg/dL — ABNORMAL HIGH (ref 0.0–149.0)
VLDL: 32.4 mg/dL (ref 0.0–40.0)

## 2019-05-06 DIAGNOSIS — Z85828 Personal history of other malignant neoplasm of skin: Secondary | ICD-10-CM | POA: Diagnosis not present

## 2019-05-06 DIAGNOSIS — L720 Epidermal cyst: Secondary | ICD-10-CM | POA: Diagnosis not present

## 2019-05-16 ENCOUNTER — Encounter: Payer: Self-pay | Admitting: Family Medicine

## 2019-06-06 ENCOUNTER — Other Ambulatory Visit: Payer: Self-pay

## 2019-06-07 ENCOUNTER — Encounter: Payer: Self-pay | Admitting: Family Medicine

## 2019-06-07 ENCOUNTER — Ambulatory Visit (INDEPENDENT_AMBULATORY_CARE_PROVIDER_SITE_OTHER): Payer: PPO

## 2019-06-07 ENCOUNTER — Ambulatory Visit (INDEPENDENT_AMBULATORY_CARE_PROVIDER_SITE_OTHER): Payer: PPO | Admitting: Family Medicine

## 2019-06-07 VITALS — BP 130/78 | Temp 98.1°F | Ht 64.0 in | Wt 121.7 lb

## 2019-06-07 VITALS — BP 130/78 | HR 74 | Temp 98.1°F | Ht 64.0 in | Wt 121.8 lb

## 2019-06-07 DIAGNOSIS — Z0000 Encounter for general adult medical examination without abnormal findings: Secondary | ICD-10-CM

## 2019-06-07 DIAGNOSIS — E782 Mixed hyperlipidemia: Secondary | ICD-10-CM

## 2019-06-07 DIAGNOSIS — I10 Essential (primary) hypertension: Secondary | ICD-10-CM | POA: Diagnosis not present

## 2019-06-07 NOTE — Progress Notes (Signed)
Phone [phone] In person visit   Subjective:   Madison Blair is a 74 y.o. year old very pleasant female patient who presents for/with See problem oriented charting Chief Complaint  Patient presents with  . Hypertension    f/u  . Hyperlipidemia    f/u  . Annual Wellness   This visit occurred during the SARS-CoV-2 public health emergency.  Safety protocols were in place, including screening questions prior to the visit, additional usage of staff PPE, and extensive cleaning of exam room while observing appropriate contact time as indicated for disinfecting solutions.   Past Medical History-  Patient Active Problem List   Diagnosis Date Noted  . Essential hypertension 06/07/2019    Priority: Medium  . Vitamin D deficiency 12/30/2016    Priority: Medium  . PVC's (premature ventricular contractions) 01/16/2016    Priority: Medium  . Positive hepatitis C antibody test 06/29/2013    Priority: Medium  . Hyperlipidemia 08/01/2011    Priority: Medium  . Fever blister 03/08/2019    Priority: Low  . Insomnia 06/11/2015    Priority: Low  . Mitral valve prolapse 06/11/2015    Priority: Low  . 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 Former smoker 06/11/2015    Priority: Low  . GERD (gastroesophageal reflux disease) 06/29/2013    Priority: Low  . Chest pain 08/01/2011    Priority: Low  . Senile purpura ([place]) 01/03/2019  . History of Mohs micrographic surgery for skin cancer 03/24/2018  . Osteopenia 12/30/2016    Medications- reviewed and updated Current Outpatient Medications  Medication Sig Dispense Refill  . amLODipine (NORVASC) 5 MG tablet Take 1 tablet (5 mg total) by mouth daily. 30 tablet 5  . aspirin 325 MG tablet Take 325 mg by mouth daily.    Marland Kitchen atorvastatin (LIPITOR) 20 MG tablet Take 1 tablet (20 mg total) by mouth daily. 90 tablet 3  . estradiol (ESTRACE) 1 MG tablet Take 1.5 mg by mouth daily.     . Multiple Vitamins-Minerals (CENTRUM SILVER PO) Take by  mouth.    . pantoprazole (PROTONIX) 40 MG tablet TAKE 1 TABLET BY MOUTH ONCE DAILY IN THE MORNING    . valACYclovir (VALTREX) 1000 MG tablet Take 2 pills twice a day for 1 day at first sign of cold sore 30 tablet 1  . amoxicillin (AMOXIL) 500 MG tablet amoxicillin 500 mg tablet     No current facility-administered medications for this visit.     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 We had a long discussion about vaccines today- she was able to sign up.   #hypertension S: compliant with amlodipine 5 mg. Feels like home #s run higher than in office but last time she checked at home was normal- does not check regularly.  BP Readings from Last 3 Encounters:  06/07/19 130/78  03/08/19 136/84  02/09/19 (!) 145/61  A/P: Stable. Continue current medications amlodipine 5 mg.   #hyperlipidemia S: compliant with atorvastatin 20mg  Lab Results  Component  Value Date   CHOL 162 04/12/2019   HDL 62.10 04/12/2019   LDLCALC 67 04/12/2019   LDLDIRECT 135.0 01/03/2019   TRIG 162.0 (H) 04/12/2019   CHOLHDL 3 04/12/2019   A/P: Well-controlled at last check-continue current medication  Recommended follow up: Already scheduled for physical Future Appointments  Date Time Provider [place]  07/11/2019 10:15 AM [place] [facility]  01/10/2020 10:40 AM Marin Olp, MD [facility]    Lab/Order associations:   ICD-10-CM   1. Essential hypertension  I10   2. Mixed hyperlipidemia  E78.2    Return precautions advised.  Garret Reddish, MD

## 2019-06-07 NOTE — Progress Notes (Signed)
Subjective:   Madison Blair is a 74 y.o. female who presents for Medicare Annual (Subsequent) preventive examination.  Review of Systems:   Cardiac Risk Factors include: advanced age (>21men, >48 women);hypertension;dyslipidemia    Objective:     Vitals: BP 130/78   Temp 98.1 F (36.7 C) (Temporal)   Ht 5\' 4"  (1.626 m)   Wt 121 lb 11.1 oz (55.2 kg)   BMI 20.89 kg/m   Body mass index is 20.89 kg/m.  Advanced Directives 06/07/2019 01/12/2019 05/04/2016  Does Patient Have a Medical Advance Directive? Yes No No  Type of Advance Directive Living will;Healthcare Power of Attorney - -  Does patient want to make changes to medical advance directive? No - Patient declined - -  Copy of Joy in Chart? No - copy requested - -  Would patient like information on creating a medical advance directive? - Yes (ED - Information included in AVS) -    Tobacco Social History   Tobacco Use  Smoking Status Former Smoker  . Packs/day: 1.00  . Years: 30.00  . Pack years: 30.00  . Types: Cigarettes  . Quit date: 05/19/1990  . Years since quitting: 29.0  Smokeless Tobacco Never Used     Counseling given: Not Answered   Clinical Intake:  Pre-visit preparation completed: Yes  Pain : No/denies pain  Diabetes: No  How often do you need to have someone help you when you read instructions, pamphlets, or other written materials from your doctor or pharmacy?: 1 - Never  Interpreter Needed?: No  Information entered by :: Denman George LPN  Past Medical History:  Diagnosis Date  . Hyperlipidemia   . PUD (peptic ulcer disease)    x6   Past Surgical History:  Procedure Laterality Date  . ABDOMINAL HYSTERECTOMY     TAAH, BSO- removed cervix. bladder tack that failed  . APPENDECTOMY    . Bladder tack     failed  . TONSILLECTOMY     Family History  Problem Relation Age of Onset  . Heart disease Mother        MVR; MI 6  . Breast cancer Mother        in  72s  . Heart disease Father        Pacemaker   Social History   Socioeconomic History  . Marital status: Divorced    Spouse name: Not on file  . Number of children: 2   . Years of education: Not on file  . Highest education level: Not on file  Occupational History    Comment: Retired  Tobacco Use  . Smoking status: Former Smoker    Packs/day: 1.00    Years: 30.00    Pack years: 30.00    Types: Cigarettes    Quit date: 05/19/1990    Years since quitting: 29.0  . Smokeless tobacco: Never Used  Substance and Sexual Activity  . Alcohol use: Yes    Alcohol/week: 0.0 standard drinks    Comment: Rarely  . Drug use: No  . Sexual activity: Not on file  Other Topics Concern  . Not on file  Social History Narrative   Divorced. 2 children (daughter 58, son 31 in 2017). 2 grandchildren from daughter 3511 month old Madison Blair, mckenzie 1 month in 2017).       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 Difficulty of Paying Living Expenses: Not on file  Food Insecurity:   . Worried About Charity fundraiser in the Last Year: Not on file  . Ran Out of Food in the Last Year: Not on file  Transportation Needs:   . Lack of Transportation (Medical): Not on file  . Lack of Transportation (Non-Medical): Not on file  Physical Activity:   . Days of Exercise per Week: Not on file  . Minutes of Exercise per Session: Not on file  Stress:   . Feeling of Stress : Not on file  Social Connections:   . Frequency of Communication with Friends and Family: Not on file  . Frequency of Social Gatherings with Friends and Family: Not on file  . Attends Religious Services: Not on file  . Active Member of Clubs or Organizations: Not on file  . Attends Archivist Meetings: Not on file  . Marital Status: Not on file     Outpatient Encounter Medications as of 06/07/2019  Medication Sig  . amLODipine (NORVASC) 5 MG tablet Take 1 tablet (5 mg total) by mouth daily.  Marland Kitchen aspirin 325 MG tablet Take 325 mg by mouth daily.  Marland Kitchen atorvastatin (LIPITOR) 20 MG tablet Take 1 tablet (20 mg total) by mouth daily.  Marland Kitchen estradiol (ESTRACE) 1 MG tablet Take 1.5 mg by mouth daily.   . Multiple Vitamins-Minerals (CENTRUM SILVER PO) Take by mouth.  . pantoprazole (PROTONIX) 40 MG tablet TAKE 1 TABLET BY MOUTH ONCE DAILY IN THE MORNING  . valACYclovir (VALTREX) 1000 MG tablet Take 2 pills twice a day for 1 day at first sign of cold sore   No facility-administered encounter medications on file as of 06/07/2019.    Activities of Daily Living In your present state of health, do you have any difficulty performing the following activities: 06/07/2019  Hearing? N  Vision? N  Difficulty concentrating or making decisions? N  Walking or climbing stairs? N  Dressing or bathing? N  Doing errands, shopping? N  Preparing Food and eating ? N  Using the Toilet? N  In the past six months, have you accidently leaked urine? N  Do you have problems with loss of bowel control? N  Managing your Medications? N  Managing your Finances? N  Housekeeping or managing your Housekeeping? N  Some recent data might be hidden    Patient Care Team: Marin Olp, MD as PCP - General (Family Medicine) Lorretta Harp, MD as Consulting Physician (Cardiology)    Assessment:   This is a routine wellness examination for Madison Blair.  Exercise Activities and Dietary recommendations Current Exercise Habits: Home exercise routine, Time (Minutes): 30, Frequency (Times/Week): 3, Weekly Exercise (Minutes/Week): 90, Intensity: Mild  Goals   None     Fall Risk Fall Risk  06/07/2019 03/08/2019 01/03/2019 12/31/2017 12/30/2016  Falls in the past year? 0 0 0 No Yes  Number falls in past yr: 0 0 0 - 2 or more  Injury with Fall? 0 0 0 - Yes  Risk for fall  due to : - - - - Other (Comment)  Risk for fall due to: Comment - - - - Stepped off the curb wrong, Tripped on the sidewalk  Follow up Falls evaluation completed;Education provided;Falls prevention discussed - - - -   Is the patient's home free of loose throw rugs in walkways, pet beds, electrical cords, etc?  yes      Grab bars in the bathroom? yes      Handrails on the stairs?   yes      Adequate lighting?  Yes   Timed Get Up and Go performed: completed and within normal timeframe; no gait abnormalities noted   Depression Screen PHQ 2/9 Scores 06/07/2019 03/08/2019 01/03/2019 12/31/2017  PHQ - 2 Score 0 0 0 0     Cognitive Function - no cognitive concerns at this time  Cognitive Testing  Alert? Yes         Normal Appearance? Yes  Oriented to person? Yes           Place? Yes  Time? Yes  Recall of three objects? Yes  Can perform simple calculations? Yes  Displays appropriate judgment? Yes  Can read the correct time from a watch face? Yes     Immunization History  Administered Date(s) Administered  . Influenza-Unspecified 03/04/2017  . Pneumococcal Conjugate-13 07/04/2013  . Pneumococcal Polysaccharide-23 12/30/2016  . Tdap 06/29/2010    Qualifies for Shingles Vaccine?Discussed and patient will check with pharmacy for coverage.  Patient education handout provided   Screening Tests Health Maintenance  Topic Date Due  . INFLUENZA VACCINE  08/17/2019 (Originally 12/18/2018)  . COLONOSCOPY  06/06/2020 (Originally 02/27/2019)  . MAMMOGRAM  12/29/2019  . TETANUS/TDAP  06/29/2020  . DEXA SCAN  Completed  . Hepatitis C Screening  Completed  . PNA vac Low Risk Adult  Completed    Cancer Screenings: Lung: Low Dose CT Chest recommended if Age 19-80 years, 30 pack-year currently smoking OR have quit w/in 15years. Patient does not qualify. Breast:  Up to date on Mammogram? Yes   Up to date of Bone Density/Dexa? Yes Colorectal: Patient declines at this time     Plan:  I have  personally reviewed and addressed the Medicare Annual Wellness questionnaire and have noted the following in the patient's chart:  A. Medical and social history B. Use of alcohol, tobacco or illicit drugs  C. Current medications and supplements D. Functional ability and status E.  Nutritional status F.  Physical activity G. Advance directives H. List of other physicians I.  Hospitalizations, surgeries, and ER visits in previous 12 months J.  [place] such as hearing and vision if needed, cognitive and depression L. Referrals, records requested, and appointments- none    In addition, I have reviewed and discussed with patient certain preventive protocols, quality metrics, and best practice recommendations. A written personalized care plan for preventive services as well as general preventive health recommendations were provided to patient.   Signed,  Denman George, LPN  Nurse Health Advisor   Nurse Notes: no additional

## 2019-06-07 NOTE — Assessment & Plan Note (Signed)
S: compliant with amlodipine 5 mg-started and titrated up after multiple elevated blood pressure readings. Feels like home #s run higher than in office but last time she checked at home was normal- does not check regularly.  BP Readings from Last 3 Encounters:  06/07/19 130/78  03/08/19 136/84  02/09/19 (!) 145/61  A/P: Stable. Continue current medications amlodipine 5 mg.

## 2019-06-07 NOTE — Patient Instructions (Addendum)
Blood pressure looks good today! Continue current meds for that and choleserol  Recommended follow up: look forward to seeing you at your physical

## 2019-06-07 NOTE — Patient Instructions (Signed)
Madison Blair , Thank you for taking time to come for your Medicare Wellness Visit. I appreciate your ongoing commitment to your health goals. Please review the following plan we discussed and let me know if I can assist you in the future.   Screening recommendations/referrals: Colorectal Screening: recommended Mammogram: up to date; last 01/25/19 Bone Density: up to date; last 01/2019  Vision and Dental Exams: Recommended annual ophthalmology exams for early detection of glaucoma and other disorders of the eye Recommended annual dental exams for proper oral hygiene  Vaccinations: Influenza vaccine: recommended;  either at PCP office or through your local pharmacy  Pneumococcal vaccine: up to date; last 12/30/16 Tdap vaccine: up to date; last 06/29/10  Shingles vaccine: Please call your insurance company to determine your out of pocket expense for the Shingrix vaccine. You may receive this vaccine at your local pharmacy. (see attached information)  Advanced directives: Please bring a copy of your POA (Power of Attorney) and/or Living Will to your next appointment.  Goals: Recommend to drink at least 6-8 8oz glasses of water per day and consume a balanced diet rich in fresh fruits and vegetables.  Next appointment: Please schedule your Annual Wellness Visit with your Nurse Health Advisor in one year.  Preventive Care 65 Years and Older, Female Preventive care refers to lifestyle choices and visits with your health care provider that can promote health and wellness. What does preventive care include?  A yearly physical exam. This is also called an annual well check.  Dental exams once or twice a year.  Routine eye exams. Ask your health care provider how often you should have your eyes checked.  Personal lifestyle choices, including:  Daily care of your teeth and gums.  Regular physical activity.  Eating a healthy diet.  Avoiding tobacco and drug use.  Limiting alcohol  use.  Practicing safe sex.  Taking low-dose aspirin every day if recommended by your health care provider.  Taking vitamin and mineral supplements as recommended by your health care provider. What happens during an annual well check? The services and screenings done by your health care provider during your annual well check will depend on your age, overall health, lifestyle risk factors, and family history of disease. Counseling  Your health care provider may ask you questions about your:  Alcohol use.  Tobacco use.  Drug use.  Emotional well-being.  Home and relationship well-being.  Sexual activity.  Eating habits.  History of falls.  Memory and ability to understand (cognition).  Work and work Statistician.  Reproductive health. Screening  You may have the following tests or measurements:  Height, weight, and BMI.  Blood pressure.  Lipid and cholesterol levels. These may be checked every 5 years, or more frequently if you are over 42 years old.  Skin check.  Lung cancer screening. You may have this screening every year starting at age 60 if you have a 30-pack-year history of smoking and currently smoke or have quit within the past 15 years.  Fecal occult blood test (FOBT) of the stool. You may have this test every year starting at age 67.  Flexible sigmoidoscopy or colonoscopy. You may have a sigmoidoscopy every 5 years or a colonoscopy every 10 years starting at age 105.  Hepatitis C blood test.  Hepatitis B blood test.  Sexually transmitted disease (STD) testing.  Diabetes screening. This is done by checking your blood sugar (glucose) after you have not eaten for a while (fasting). You may have this done  every 1-3 years.  Bone density scan. This is done to screen for osteoporosis. You may have this done starting at age 51.  Mammogram. This may be done every 1-2 years. Talk to your health care provider about how often you should have regular  mammograms. Talk with your health care provider about your test results, treatment options, and if necessary, the need for more tests. Vaccines  Your health care provider may recommend certain vaccines, such as:  Influenza vaccine. This is recommended every year.  Tetanus, diphtheria, and acellular pertussis (Tdap, Td) vaccine. You may need a Td booster every 10 years.  Zoster vaccine. You may need this after age 5.  Pneumococcal 13-valent conjugate (PCV13) vaccine. One dose is recommended after age 41.  Pneumococcal polysaccharide (PPSV23) vaccine. One dose is recommended after age 75. Talk to your health care provider about which screenings and vaccines you need and how often you need them. This information is not intended to replace advice given to you by your health care provider. Make sure you discuss any questions you have with your health care provider. Document Released: 06/01/2015 Document Revised: 01/23/2016 Document Reviewed: 03/06/2015 Elsevier Interactive Patient Education  2017 [place] Prevention in the Home Falls can cause injuries. They can happen to people of all ages. There are many things you can do to make your home safe and to help prevent falls. What can I do on the outside of my home?  Regularly fix the edges of walkways and driveways and fix any cracks.  Remove anything that might make you trip as you walk through a door, such as a raised step or threshold.  Trim any bushes or trees on the path to your home.  Use bright outdoor lighting.  Clear any walking paths of anything that might make someone trip, such as rocks or tools.  Regularly check to see if handrails are loose or broken. Make sure that both sides of any steps have handrails.  Any raised decks and porches should have guardrails on the edges.  Have any leaves, snow, or ice cleared regularly.  Use sand or salt on walking paths during winter.  Clean up any spills in your garage  right away. This includes oil or grease spills. What can I do in the bathroom?  Use night lights.  Install grab bars by the toilet and in the tub and shower. Do not use towel bars as grab bars.  Use non-skid mats or decals in the tub or shower.  If you need to sit down in the shower, use a plastic, non-slip stool.  Keep the floor dry. Clean up any water that spills on the floor as soon as it happens.  Remove soap buildup in the tub or shower regularly.  Attach bath mats securely with double-sided non-slip rug tape.  Do not have throw rugs and other things on the floor that can make you trip. What can I do in the bedroom?  Use night lights.  Make sure that you have a light by your bed that is easy to reach.  Do not use any sheets or blankets that are too big for your bed. They should not hang down onto the floor.  Have a firm chair that has side arms. You can use this for support while you get dressed.  Do not have throw rugs and other things on the floor that can make you trip. What can I do in the kitchen?  Clean up any spills right  away.  Avoid walking on wet floors.  Keep items that you use a lot in easy-to-reach places.  If you need to reach something above you, use a strong step stool that has a grab bar.  Keep electrical cords out of the way.  Do not use floor polish or wax that makes floors slippery. If you must use wax, use non-skid floor wax.  Do not have throw rugs and other things on the floor that can make you trip. What can I do with my stairs?  Do not leave any items on the stairs.  Make sure that there are handrails on both sides of the stairs and use them. Fix handrails that are broken or loose. Make sure that handrails are as long as the stairways.  Check any carpeting to make sure that it is firmly attached to the stairs. Fix any carpet that is loose or worn.  Avoid having throw rugs at the top or bottom of the stairs. If you do have throw rugs,  attach them to the floor with carpet tape.  Make sure that you have a light switch at the top of the stairs and the bottom of the stairs. If you do not have them, ask someone to add them for you. What else can I do to help prevent falls?  Wear shoes that:  Do not have high heels.  Have rubber bottoms.  Are comfortable and fit you well.  Are closed at the toe. Do not wear sandals.  If you use a stepladder:  Make sure that it is fully opened. Do not climb a closed stepladder.  Make sure that both sides of the stepladder are locked into place.  Ask someone to hold it for you, if possible.  Clearly mark and make sure that you can see:  Any grab bars or handrails.  First and last steps.  Where the edge of each step is.  Use tools that help you move around (mobility aids) if they are needed. These include:  Canes.  Walkers.  Scooters.  Crutches.  Turn on the lights when you go into a dark area. Replace any light bulbs as soon as they burn out.  Set up your furniture so you have a clear path. Avoid moving your furniture around.  If any of your floors are uneven, fix them.  If there are any pets around you, be aware of where they are.  Review your medicines with your doctor. Some medicines can make you feel dizzy. This can increase your chance of falling. Ask your doctor what other things that you can do to help prevent falls. This information is not intended to replace advice given to you by your health care provider. Make sure you discuss any questions you have with your health care provider. Document Released: 03/01/2009 Document Revised: 10/11/2015 Document Reviewed: 06/09/2014 Elsevier Interactive Patient Education  2017 [employer].

## 2019-06-16 ENCOUNTER — Encounter: Payer: Self-pay | Admitting: Family Medicine

## 2019-06-17 ENCOUNTER — Encounter: Payer: Self-pay | Admitting: Family Medicine

## 2019-06-26 ENCOUNTER — Ambulatory Visit: Payer: PPO

## 2019-07-11 ENCOUNTER — Ambulatory Visit: Payer: PPO

## 2019-07-11 ENCOUNTER — Encounter: Payer: Self-pay | Admitting: Family Medicine

## 2019-07-12 ENCOUNTER — Ambulatory Visit (INDEPENDENT_AMBULATORY_CARE_PROVIDER_SITE_OTHER): Payer: PPO | Admitting: Physician Assistant

## 2019-07-12 ENCOUNTER — Encounter: Payer: Self-pay | Admitting: Physician Assistant

## 2019-07-12 ENCOUNTER — Other Ambulatory Visit: Payer: Self-pay

## 2019-07-12 VITALS — BP 120/78 | HR 73 | Temp 97.7°F | Ht 64.0 in | Wt 121.8 lb

## 2019-07-12 DIAGNOSIS — I341 Nonrheumatic mitral (valve) prolapse: Secondary | ICD-10-CM

## 2019-07-12 DIAGNOSIS — L609 Nail disorder, unspecified: Secondary | ICD-10-CM

## 2019-07-12 MED ORDER — DOXYCYCLINE HYCLATE 100 MG PO TABS: 100.0000 mg | ORAL_TABLET | Freq: Two times a day (BID) | ORAL | 0 refills | Status: DC

## 2019-07-12 MED ORDER — AMOXICILLIN 500 MG PO TABS: 2000.0000 mg | ORAL_TABLET | Freq: Once | ORAL | 1 refills | Status: AC

## 2019-07-12 NOTE — Progress Notes (Signed)
Madison Blair is a 74 y.o. female here for a new problem.  I acted as a Education administrator for [employer], PA-C Anselmo Pickler, LPN  History of Present Illness:   Chief Complaint  Patient presents with  . Nail Problem    Right ring finger; Pt says that it started yesterday. it is warm to the touch.    HPI   Nail problem She noticed yesterday that her R ring finger was tender and slightly swollen. It was also warm to the touch. She has tried soaking it in epsom salt and applying neosporin without significant relief of symptoms. She does get her nails done regularly and goes to the same place that she has been going to for the past 20 years. Denies: trauma, purulent discharge, fever, chills, malaise.  MVP Has a dental procedure coming up and needs amoxicillin for pre-treatment. Has taken this all her life prior to dental procedures.  Past Medical History:  Diagnosis Date  . Hyperlipidemia   . PUD (peptic ulcer disease)    x6     Social History   Socioeconomic History  . Marital status: Divorced    Spouse name: Not on file  . Number of children: 2   . Years of education: Not on file  . Highest education level: Not on file  Occupational History    Comment: Retired  Tobacco Use  . Smoking status: Former Smoker    Packs/day: 1.00    Years: 30.00    Pack years: 30.00    Types: Cigarettes    Quit date: 05/19/1990    Years since quitting: 29.1  . Smokeless tobacco: Never Used  Substance and Sexual Activity  . Alcohol use: Yes    Alcohol/week: 0.0 standard drinks    Comment: Rarely  . Drug use: No  . Sexual activity: Not on file  Other Topics Concern  . Not on file  Social History Narrative   Divorced. 2 children (daughter 60, son 68 in 2017). 2 grandchildren from daughter 2521 month old [name], mckenzie 1 month in 2017).       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 Difficulty of Paying Living Expenses: Not on file  Food Insecurity:   . Worried About Charity fundraiser in the Last Year: Not on file  . Ran Out of Food in the Last Year: Not on file  Transportation Needs:   . Lack of Transportation (Medical): Not on file  . Lack of Transportation (Non-Medical): Not on file  Physical Activity:   . Days of Exercise per Week: Not on file  . Minutes of Exercise per Session: Not on file  Stress:   . Feeling of Stress : Not on file  Social Connections:   . Frequency of Communication with Friends and Family: Not on file  . Frequency of Social Gatherings with Friends and Family: Not on file  . Attends Religious Services: Not on file  . Active Member of Clubs or Organizations: Not on file  . Attends Archivist Meetings: Not on file  . Marital Status: Not on file  Intimate Partner Violence:   . Fear of Current or Ex-Partner: Not on file  . Emotionally Abused: Not on file  . Physically Abused: Not on file  . Sexually Abused: Not on  file    Past Surgical History:  Procedure Laterality Date  . ABDOMINAL HYSTERECTOMY     TAAH, BSO- removed cervix. bladder tack that failed  . APPENDECTOMY    . Bladder tack     failed  . TONSILLECTOMY      Family History  Problem Relation Age of Onset  . Heart disease Mother        MVR; MI 79  . Breast cancer Mother        in 41s  . Heart disease Father        Pacemaker    No Known Allergies  Current Medications:   Current Outpatient Medications:  .  amLODipine (NORVASC) 5 MG tablet, Take 1 tablet (5 mg total) by mouth daily., Disp: 30 tablet, Rfl: 5 .  amoxicillin (AMOXIL) 500 MG tablet, Take 4 tablets (2,000 mg total) by mouth once for 1 dose., Disp: 4 tablet, Rfl: 1 .  aspirin 325 MG tablet, Take 325 mg by mouth daily., Disp: , Rfl:  .  atorvastatin (LIPITOR) 20 MG tablet, Take 1 tablet (20 mg total) by mouth  daily., Disp: 90 tablet, Rfl: 3 .  estradiol (ESTRACE) 1 MG tablet, Take 1.5 mg by mouth daily. , Disp: , Rfl:  .  Multiple Vitamins-Minerals (CENTRUM SILVER PO), Take by mouth., Disp: , Rfl:  .  pantoprazole (PROTONIX) 40 MG tablet, TAKE 1 TABLET BY MOUTH ONCE DAILY IN THE MORNING, Disp: , Rfl:  .  valACYclovir (VALTREX) 1000 MG tablet, Take 2 pills twice a day for 1 day at first sign of cold sore, Disp: 30 tablet, Rfl: 1 .  doxycycline (VIBRA-TABS) 100 MG tablet, Take 1 tablet (100 mg total) by mouth 2 (two) times daily., Disp: 20 tablet, Rfl: 0   Review of Systems:   ROS  Negative unless otherwise specified per HPI.  Vitals:   Vitals:   07/12/19 1500  BP: 120/78  Pulse: 73  Temp: 97.7 F (36.5 C)  TempSrc: Temporal  SpO2: 96%  Weight: 121 lb 12.8 oz (55.2 kg)  Height: 5\' 4"  (1.626 m)     Body mass index is 20.91 kg/m.  Physical Exam:   Physical Exam Constitutional:      Appearance: She is well-developed.  HENT:     Head: Normocephalic and atraumatic.  Eyes:     Conjunctiva/sclera: Conjunctivae normal.  Pulmonary:     Effort: Pulmonary effort is normal.  Musculoskeletal:        General: Normal range of motion.     Cervical back: Normal range of motion and neck supple.     Comments: Normal ROM of R digits  Skin:    General: Skin is warm and dry.     Comments: R ring finger: Tenderness to palpation of nail bed; slight area of erythema under nail, on lateral portion of nail. No active drainage.  Neurological:     Mental Status: She is alert and oriented to person, place, and time.  Psychiatric:        Behavior: Behavior normal.        Thought Content: Thought content normal.        Judgment: Judgment normal.       Assessment and Plan:   Madison Blair was seen today for nail problem.  Diagnoses and all orders for this visit:  Fingernail problem Suspect possible infection. Will trial oral doxycycline. Continue epsom soaks and provided bacitracin for her to use.  Recommended close follow-up if no improvement. Worsening precautions advised.  MVP  Refilled prn use of amoxicillin for dental procedures per patient request.  Other orders -     doxycycline (VIBRA-TABS) 100 MG tablet; Take 1 tablet (100 mg total) by mouth 2 (two) times daily.   . Reviewed expectations re: course of current medical issues. . Discussed self-management of symptoms. . Outlined signs and symptoms indicating need for more acute intervention. . Patient verbalized understanding and all questions were answered. . See orders for this visit as documented in the electronic medical record. . Patient received an After-Visit Summary.  CMA or LPN served as scribe during this visit. History, Physical, and Plan performed by medical provider. The above documentation has been reviewed and is accurate and complete.   Inda Coke, PA-C

## 2019-07-12 NOTE — Patient Instructions (Addendum)
It was great to see you!   Start the oral doxycyline for your nail infection. Let me know if this is not helping in the next 2-3 days.  Paronychia Paronychia is an infection of the skin that surrounds a nail. It usually affects the skin around a fingernail, but it may also occur near a toenail. It often causes pain and swelling around the nail. In some cases, a collection of pus (abscess) can form near or under the nail.  This condition may develop suddenly, or it may develop gradually over a longer period. In most cases, paronychia is not serious, and it will clear up with treatment. What are the causes? This condition may be caused by bacteria or a fungus. These germs can enter the body through an opening in the skin, such as a cut or a hangnail. What increases the risk? This condition is more likely to develop in people who:  Get their hands wet often, such as those who work as Designer, industrial/product, bartenders, or nurses.  Bite their fingernails or suck their thumbs.  Trim their nails very short.  Have hangnails or injured fingertips.  Get manicures.  Have diabetes. What are the signs or symptoms? Symptoms of this condition include:  Redness and swelling of the skin near the nail.  Tenderness around the nail when you touch the area.  Pus-filled bumps under the skin at the base and sides of the nail (cuticle).  Fluid or pus under the nail.  Throbbing pain in the area. How is this diagnosed? This condition is diagnosed with a physical exam. In some cases, a sample of pus may be tested to determine what type of bacteria or fungus is causing the condition. How is this treated? Treatment depends on the cause and severity of your condition. If your condition is mild, it may clear up on its own in a few days or after soaking in warm water. If needed, treatment may include:  Antibiotic medicine, if your infection is caused by bacteria.  Antifungal medicine, if your infection is caused by  a fungus.  A procedure to drain pus from an abscess.  Anti-inflammatory medicine (corticosteroids). Follow these instructions at home: Wound care  Keep the affected area clean.  Soak the affected area in warm water, if told to do so by your health care provider. You may be told to do this for 20 minutes, 2-3 times a day.  Keep the area dry when you are not soaking it.  Do not try to drain an abscess yourself.  Follow instructions from your health care provider about how to take care of the affected area. Make sure you: ? Wash your hands with soap and water before you change your bandage (dressing). If soap and water are not available, use hand sanitizer. ? Change your dressing as told by your health care provider.  If you had an abscess drained, check the area every day for signs of infection. Check for: ? Redness, swelling, or pain. ? Fluid or blood. ? Warmth. ? Pus or a bad smell. Medicines   Take over-the-counter and prescription medicines only as told by your health care provider.  If you were prescribed an antibiotic medicine, take it as told by your health care provider. Do not stop taking the antibiotic even if you start to feel better. General instructions  Avoid contact with harsh chemicals.  Do not pick at the affected area. Prevention  To prevent this condition from happening again: ? Wear rubber gloves when  washing dishes or doing other tasks that require your hands to get wet. ? Wear gloves if your hands might come in contact with cleaners or other chemicals. ? Avoid injuring your nails or fingertips. ? Do not bite your nails or tear hangnails. ? Do not cut your nails very short. ? Do not cut your cuticles. ? Use clean nail clippers or scissors when trimming nails. Contact a health care provider if:  Your symptoms get worse or do not improve with treatment.  You have continued or increased fluid, blood, or pus coming from the affected area.  Your  finger or knuckle becomes swollen or difficult to move. Get help right away if you have:  A fever or chills.  Redness spreading away from the affected area.  Joint or muscle pain. Summary  Paronychia is an infection of the skin that surrounds a nail. It often causes pain and swelling around the nail. In some cases, a collection of pus (abscess) can form near or under the nail.  This condition may be caused by bacteria or a fungus. These germs can enter the body through an opening in the skin, such as a cut or a hangnail.  If your condition is mild, it may clear up on its own in a few days. If needed, treatment may include medicine or a procedure to drain pus from an abscess.  To prevent this condition from happening again, wear gloves if doing tasks that require your hands to get wet or to come in contact with chemicals. Also avoid injuring your nails or fingertips. This information is not intended to replace advice given to you by your health care provider. Make sure you discuss any questions you have with your health care provider. Document Revised: 05/22/2017 Document Reviewed: 05/18/2017 Elsevier Patient Education  2020 [employer].

## 2019-07-13 ENCOUNTER — Other Ambulatory Visit: Payer: Self-pay | Admitting: *Deleted

## 2019-07-13 ENCOUNTER — Encounter: Payer: Self-pay | Admitting: Family Medicine

## 2019-07-13 MED ORDER — DOXYCYCLINE HYCLATE 100 MG PO TABS: 100.0000 mg | ORAL_TABLET | Freq: Two times a day (BID) | ORAL | 0 refills | Status: DC

## 2019-07-13 MED ORDER — AMOXICILLIN 500 MG PO CAPS: 2000.0000 mg | ORAL_CAPSULE | Freq: Once | ORAL | 0 refills | Status: AC

## 2019-07-14 ENCOUNTER — Encounter: Payer: Self-pay | Admitting: Family Medicine

## 2019-07-24 ENCOUNTER — Other Ambulatory Visit: Payer: Self-pay | Admitting: Family Medicine

## 2019-08-15 ENCOUNTER — Encounter: Payer: Self-pay | Admitting: Family Medicine

## 2019-10-31 ENCOUNTER — Other Ambulatory Visit: Payer: Self-pay | Admitting: Family Medicine

## 2019-12-04 ENCOUNTER — Encounter: Payer: Self-pay | Admitting: Family Medicine

## 2019-12-12 DIAGNOSIS — N6002 Solitary cyst of left breast: Secondary | ICD-10-CM | POA: Diagnosis not present

## 2019-12-12 DIAGNOSIS — N611 Abscess of the breast and nipple: Secondary | ICD-10-CM | POA: Diagnosis not present

## 2019-12-27 ENCOUNTER — Telehealth: Payer: Self-pay | Admitting: Family Medicine

## 2019-12-27 NOTE — Progress Notes (Signed)
  Chronic Care Management   Outreach Note  12/27/2019 Name: NIKOLINA SIMERSON MRN: [id number] DOB: 09-Jan-1946  Referred by: Marin Olp, MD Reason for referral : No chief complaint on file.   An unsuccessful telephone outreach was attempted today. The patient was referred to the pharmacist for assistance with care management and care coordination.   Follow Up Plan:   Earney Hamburg Upstream Scheduler

## 2019-12-28 ENCOUNTER — Telehealth: Payer: Self-pay | Admitting: Family Medicine

## 2019-12-28 NOTE — Progress Notes (Signed)
°  Chronic Care Management   Note  12/28/2019 Name: Madison Blair MRN: [id number] DOB: 04-13-1946  Madison Blair is a 74 y.o. year old female who is a primary care patient of Marin Olp, MD. I reached out to Lenord Carbo by phone today in response to a referral sent by [name] PCP, Marin Olp, MD.   [name] was given information about Chronic Care Management services today including:  1. CCM service includes personalized support from designated clinical staff supervised by her physician, including individualized plan of care and coordination with other care providers 2. 24/7 contact phone numbers for assistance for urgent and routine care needs. 3. Service will only be billed when office clinical staff spend 20 minutes or more in a month to coordinate care. 4. Only one practitioner may furnish and bill the service in a calendar month. 5. The patient may stop CCM services at any time (effective at the end of the month) by phone call to the office staff.   Patient agreed to services and verbal consent obtained.   Follow up plan:  Earney Hamburg Upstream Scheduler

## 2020-01-02 ENCOUNTER — Encounter: Payer: Self-pay | Admitting: Family Medicine

## 2020-01-03 ENCOUNTER — Telehealth (INDEPENDENT_AMBULATORY_CARE_PROVIDER_SITE_OTHER): Payer: PPO | Admitting: Family Medicine

## 2020-01-03 ENCOUNTER — Encounter: Payer: Self-pay | Admitting: Family Medicine

## 2020-01-03 VITALS — BP 120/78 | Temp 98.0°F | Ht 64.0 in | Wt 123.0 lb

## 2020-01-03 DIAGNOSIS — J029 Acute pharyngitis, unspecified: Secondary | ICD-10-CM

## 2020-01-03 MED ORDER — PREDNISONE 20 MG PO TABS: 20.0000 mg | ORAL_TABLET | Freq: Every day | ORAL | 0 refills | Status: DC

## 2020-01-03 MED ORDER — AZITHROMYCIN 250 MG PO TABS: ORAL_TABLET | ORAL | 0 refills | Status: DC

## 2020-01-03 NOTE — Progress Notes (Signed)
   Madison Blair is a 74 y.o. female who presents today for a virtual office visit.  Assessment/Plan:  Sore Throat Likely viral URI.  No red flags.  Discussed limitations of virtual visit and inability to perform physical exam.  Recent course of antibiotics does raise possibility of possible thrush though no obvious patches on limited exam today.  Will start course of prednisone.  Will send in a "pocket prescription" for azithromycin with instruction to not start unless symptoms worsen or not improve the next several days.  Encourage good oral hydration.  Can use over-the-counter meds if needed.    Subjective:  HPI:  Symptoms started 2 days ago. No fever. No nausea or vomiting.  No sick contacts.  Symptoms are stable over the last couple of days.  No rhinorrhea.  Took Benadryl with some improvement.  No other treatments tried.  No fevers or chills.  No shortness of breath.  Was recently started on Bactrim for breast abscess.       Objective/Observations  Physical Exam: Gen: NAD, resting comfortably Pulm: Normal work of breathing Neuro: Grossly normal, moves all extremities Psych: Normal affect and thought content  Virtual Visit via Video   I connected with Madison Blair on 01/03/20 at 11:20 AM EDT by a video enabled telemedicine application and verified that I am speaking with the correct person using two identifiers. The limitations of evaluation and management by telemedicine and the availability of in person appointments were discussed. The patient expressed understanding and agreed to proceed.   Patient location: Home Provider location: [place] participating in the virtual visit: Myself and Patient     Algis Greenhouse. Jerline Pain, MD 01/03/2020 11:58 AM

## 2020-01-05 ENCOUNTER — Encounter: Payer: PPO | Admitting: Family Medicine

## 2020-01-05 DIAGNOSIS — Z85828 Personal history of other malignant neoplasm of skin: Secondary | ICD-10-CM | POA: Diagnosis not present

## 2020-01-05 DIAGNOSIS — D692 Other nonthrombocytopenic purpura: Secondary | ICD-10-CM | POA: Diagnosis not present

## 2020-01-05 DIAGNOSIS — L821 Other seborrheic keratosis: Secondary | ICD-10-CM | POA: Diagnosis not present

## 2020-01-05 DIAGNOSIS — L814 Other melanin hyperpigmentation: Secondary | ICD-10-CM | POA: Diagnosis not present

## 2020-01-05 DIAGNOSIS — L918 Other hypertrophic disorders of the skin: Secondary | ICD-10-CM | POA: Diagnosis not present

## 2020-01-05 DIAGNOSIS — D1801 Hemangioma of skin and subcutaneous tissue: Secondary | ICD-10-CM | POA: Diagnosis not present

## 2020-01-09 NOTE — Patient Instructions (Addendum)
Please stop by lab before you go If you have mychart- we will send your results within 3 business days of [place] receiving them.  If you do not have mychart- we will call you about results within 5 business days of [place] receiving them.  *please note we are currently using [facility] which has a longer processing time than [facility] typically so labs may not come back as quickly as in the past *please also note that you will see labs on mychart as soon as they post. I will later go in and write notes on them- will say "notes from Dr. Yong Channel"  blood pressure slightly high today for the first time in a while- lets do some home monitoring and make sure 138/88 or less before considering increasing medicine. Update me in 2 weeks by mychart with how its lookings  Health Maintenance Due  Topic Date Due  . MAMMOGRAM  Has appointment next month  - make sure they send [place] a copy of mammogram.   Team please request 2020 mammogram as well- we did not receive this one 12/29/2019  . INFLUENZA VACCINE --  - will complete later in flu season (please let us know if you get this at another location so we can update your chart) . We should have vaccination here in 1-2 months - can call back for an appointment.   Get covid booster at 8 months from 07/14/19- separate flu shot by at least 2 weeks  12/18/2019   She opts for cologuard-if you do not receive this within 3 weeks please let us know  Opts out of shingrix for now   ideally would get 1200mg  calcium and 1000 units of vitamin D. Centrum silver should have some and team please give her handout for calcium in diet.

## 2020-01-09 NOTE — Progress Notes (Signed)
Phone [phone]   Subjective:  Patient presents today for their annual physical. Chief complaint-noted.   See problem oriented charting- Review of Systems  Constitutional: Negative for chills and fever.  HENT: Negative for hearing loss and tinnitus.   Eyes: Negative for blurred vision and double vision.  Respiratory: Positive for cough. Negative for shortness of breath and wheezing.        Dry cough thinks it may be due to BP meds   Cardiovascular: Negative for chest pain, palpitations and leg swelling.  Gastrointestinal: Negative for blood in stool, heartburn and nausea.  Genitourinary: Negative for dysuria, frequency and urgency.  Musculoskeletal: Negative for back pain, joint pain and neck pain.  Skin: Negative for rash.  Neurological: Negative for dizziness, seizures, weakness and headaches.  Endo/Heme/Allergies: Does not bruise/bleed easily.  Psychiatric/Behavioral: Negative for depression, hallucinations, memory loss and suicidal ideas.    The following were reviewed and entered/updated in epic: Past Medical History:  Diagnosis Date  . Hyperlipidemia   . PUD (peptic ulcer disease)    x6   Patient Active Problem List   Diagnosis Date Noted  . Essential hypertension 06/07/2019    Priority: Medium  . Vitamin D deficiency 12/30/2016    Priority: Medium  . PVC's (premature ventricular contractions) 01/16/2016    Priority: Medium  . Positive hepatitis C antibody test 06/29/2013    Priority: Medium  . Hyperlipidemia 08/01/2011    Priority: Medium  . Fever blister 03/08/2019    Priority: Low  . Insomnia 06/11/2015    Priority: Low  . Mitral valve prolapse 06/11/2015    Priority: Low  . 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 Former smoker 06/11/2015    Priority: Low  . GERD (gastroesophageal reflux disease) 06/29/2013    Priority: Low  . Chest pain 08/01/2011    Priority: Low  . 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 History of Mohs micrographic surgery for  skin cancer 03/24/2018  . Osteopenia 12/30/2016   Past Surgical History:  Procedure Laterality Date  . ABDOMINAL HYSTERECTOMY     TAAH, BSO- removed cervix. bladder tack that failed  . APPENDECTOMY    . Bladder tack     failed  . TONSILLECTOMY      Family History  Problem Relation Age of Onset  . Heart disease Mother        MVR; MI 29  . Breast cancer Mother        in 1s  . Heart disease Father        Pacemaker    Medications- reviewed and updated Current Outpatient Medications  Medication Sig Dispense Refill  . amLODipine (NORVASC) 5 MG tablet TAKE 1 TABLET BY MOUTH EVERY DAY 90 tablet 3  . aspirin 325 MG tablet Take 325 mg by mouth daily.    Marland Kitchen atorvastatin (LIPITOR) 20 MG tablet Take 1 tablet (20 mg total) by mouth daily. 90 tablet 3  . azithromycin (ZITHROMAX) 250 MG tablet Take 2 tabs day 1, then 1 tab daily 6 each 0  . estradiol (ESTRACE) 1 MG tablet Take 1.5 mg by mouth daily.     . Multiple Vitamins-Minerals (CENTRUM SILVER PO) Take by mouth.    . pantoprazole (PROTONIX) 40 MG tablet TAKE 1 TABLET BY MOUTH ONCE DAILY IN THE MORNING    . predniSONE (DELTASONE) 20 MG tablet Take 1 tablet (20 mg total) by mouth daily with breakfast. 7 tablet 0  . valACYclovir (VALTREX) 1000 MG tablet TAKE 2 TABLETS BY  MOUTH TWICE A DAY FOR 1 DAY AT FIRST SIGN OF COLD SORE 30 tablet 1   No current facility-administered medications for this visit.    Allergies-reviewed and updated No Known Allergies  Social History   Social History Narrative   Divorced. 2 children (daughter 65, son 53 in 2017). 2 grandchildren from daughter 7134 month old [name], mckenzie 1 month in 2017).       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 Oropharynx normal. Some wax bilateral ear canal but portoin of TM seen is normal.  Neck: no thyromegaly CV: RRR no murmurs rubs or gallops Lungs: CTAB no crackles, wheeze, rhonchi Abdomen: soft/nontender/nondistended/normal bowel sounds. No rebound or guarding.  Ext: no edema Skin: warm, dry Neuro: grossly normal, moves all extremities, PERRLA     Assessment and Plan   74 y.o. female presenting for annual physical.  Health Maintenance counseling: 1. Anticipatory guidance: Patient counseled regarding regular dental exams q6 months, eye exams Yearly,  avoiding smoking and second hand smoke , limiting alcohol to 1 beverage per day does not drink quit 5 years ago .   2. Risk factor reduction:  Advised patient of need for regular exercise and diet rich and fruits and vegetables to reduce risk of heart attack and stroke. Exercise-  Dancing three days a week.  Diet- Has healthy diet. Tries to avoid processed food.  Want her to avoid weight loss- thankful for weight stability recently Wt Readings from Last 3 Encounters:  01/10/20 124 lb 9.6 oz (56.5 kg)  01/03/20 123 lb (55.8 kg)  07/12/19 121 lb 12.8 oz (55.2 kg)  3. Immunizations/screenings/ancillary studies-recommended flu shot in the fall.  Discussed Shingrix - opts out for now Immunization History  Administered Date(s) Administered  . Influenza-Unspecified 03/04/2017  . PFIZER SARS-COV-2 Vaccination 06/16/2019, 07/14/2019  . Pneumococcal Conjugate-13 07/04/2013  . Pneumococcal Polysaccharide-23 12/30/2016  . Tdap 06/29/2010  4. Cervical cancer screening- hysterectomy for benign reasons-still does Paps with gynecology 5. Breast cancer screening-  breast exam self exam monthly and mammogram has appointment in September.  Slightly overdue 6. Colon cancer screening - has done this with [facility].  Due for 10-year repeat at this time-declines due to covid  but open to cologuard- ordered today 7. Skin cancer screening-[place] [facility] yearly- saw last week. advised regular sunscreen use. Denies worrisome, changing, or new skin lesions.    8. Birth control/STD check-postmenopausal and declines std screening as monogomous with boyfriend 9. Osteoporosis screening at 12- osteopenia lumbar spine noted September 2020. ideally would get 1200mg  calcium and 1000 units of vitamin D.  -former  Smoker quit 32 years ago  Status of chronic or acute concerns   # dry cough- mild intermittent- not too bothersome for 3 weeks. If persists past 6 weeks she will let us know and we can get CXR with prior smoking- quit months ago.   #sore throat- Concern for likely viral URI.  Patient was not tested for Covid at that time- would be lower risk as vaccinated.  She was given a prescription for prednisone as well  as azithromycin-today she reports symptoms cleared up with prednisone. Would be over 10 days from symptom onset. This also helped her back pain.  -also prior to that had abscess that had to be drained from breast thankfully that is doing better  #Atypical chest pain in late 2020-all cardiology and they thought it was musculoskeletal.  She was recommended to have CT cardiac scoring by Dr. Gwenlyn Found- she never completed this.    #hyperlipidemia S: Medication: Atorvastatin 20Mg .  She also prefers to be on aspirin for primary prevention Lab Results  Component Value Date   CHOL 162 04/12/2019   HDL 62.10 04/12/2019   LDLCALC 67 04/12/2019   LDLDIRECT 135.0 01/03/2019   TRIG 162.0 (H) 04/12/2019   CHOLHDL 3 04/12/2019   A/P: hopefully remains controlled on atorvastatin 20 mg- update lipid panel today  # GERD-history of 6 peptic ulcers in the past S:medication: Protonix 40Mg   B12 levels related to PPI use:  No results found for: VITAMINB12 A/P: Multiple ulcerations in the past-I think she likely needs to remain on something but I would like to give her the lowest  effective dose and possibly transition to Pepcid if possible-we will start by reducing Protonix to 20 mg -Check B12 level as well given long-term PPI use  #hypertension S: medication: amlodipine 5Mg  at night Home readings #s: has a cuff but doesn't check regularly BP Readings from Last 3 Encounters:  01/10/20 140/70  01/03/20 120/78  07/12/19 120/78  A/P: blood pressure slightly high today for the first time in a while- lets do some home monitoring and make sure 138/88 or less before considering increasing medicine. Update me in 2 weeks by mychart with how its lookings  #Vitamin D deficiency S: Medication: none Last vitamin D Lab Results  Component Value Date   VD25OH 31.39 01/03/2019  A/P: hopefully controlled- update vitamin D   #osteopenia- discovered by Dr. Stann Mainland- continue weight bearing exercise. Also make sure at least getting [phone] units of vitamin D  Reactive hepatitis C test in the past-quantitative analysis was negative so either has cleared hepatitis C or was false positive  Senile purpura-still with some easy bruising/bleeding.  Likely contributed to by her aspirin- she has cut down to a 4th of a 325- has been better- would switch to 81mg  when done with current bottle  Recommended follow up: Return in about 6 months (around 07/12/2020) for follow up- or sooner if needed such as if blood pressure is high. Future Appointments  Date Time Provider [place]  03/07/2020  9:00 AM [facility] PHARMACIST [facility]   Lab/Order associations: fasting   ICD-10-CM   1. Essential hypertension  I10 CBC with Differential/Platelet    Comprehensive metabolic panel    Lipid panel  2. Gastroesophageal reflux disease without esophagitis  K21.9   3. Mixed hyperlipidemia  E78.2 CBC with Differential/Platelet    Comprehensive metabolic panel    Lipid panel  4. Vitamin D deficiency  E55.9 VITAMIN D 25 Hydroxy (Vit-D Deficiency, Fractures)  5. High risk medication use  Z79.899  Vitamin B12    Meds ordered this encounter  Medications  . atorvastatin (LIPITOR) 20 MG tablet    Sig: Take 1 tablet (20 mg total) by mouth daily.    Dispense:  90 tablet    Refill:  3  . pantoprazole (PROTONIX) 20 MG tablet    Sig: Take 1 tablet (20 mg total) by mouth daily.    Dispense:  90 tablet    Refill:  3  Return precautions advised.  Garret Reddish, MD

## 2020-01-10 ENCOUNTER — Other Ambulatory Visit: Payer: Self-pay

## 2020-01-10 ENCOUNTER — Ambulatory Visit (INDEPENDENT_AMBULATORY_CARE_PROVIDER_SITE_OTHER): Payer: PPO | Admitting: Family Medicine

## 2020-01-10 ENCOUNTER — Encounter: Payer: Self-pay | Admitting: Family Medicine

## 2020-01-10 VITALS — BP 140/70 | HR 71 | Temp 98.5°F | Ht 64.0 in | Wt 124.6 lb

## 2020-01-10 DIAGNOSIS — Z1211 Encounter for screening for malignant neoplasm of colon: Secondary | ICD-10-CM

## 2020-01-10 DIAGNOSIS — I10 Essential (primary) hypertension: Secondary | ICD-10-CM

## 2020-01-10 DIAGNOSIS — E782 Mixed hyperlipidemia: Secondary | ICD-10-CM

## 2020-01-10 DIAGNOSIS — E559 Vitamin D deficiency, unspecified: Secondary | ICD-10-CM

## 2020-01-10 DIAGNOSIS — D692 Other nonthrombocytopenic purpura: Secondary | ICD-10-CM

## 2020-01-10 DIAGNOSIS — Z79899 Other long term (current) drug therapy: Secondary | ICD-10-CM | POA: Diagnosis not present

## 2020-01-10 DIAGNOSIS — K219 Gastro-esophageal reflux disease without esophagitis: Secondary | ICD-10-CM | POA: Diagnosis not present

## 2020-01-10 LAB — COMPREHENSIVE METABOLIC PANEL
AG Ratio: 1.4 (calc) (ref 1.0–2.5)
ALT: 15 U/L (ref 6–29)
AST: 18 U/L (ref 10–35)
Albumin: 3.8 g/dL (ref 3.6–5.1)
Alkaline phosphatase (APISO): 66 U/L (ref 37–153)
BUN: 16 mg/dL (ref 7–25)
CO2: 27 mmol/L (ref 20–32)
Calcium: 9.5 mg/dL (ref 8.6–10.4)
Chloride: 105 mmol/L (ref 98–110)
Creat: 0.71 mg/dL (ref 0.60–0.93)
Globulin: 2.7 g/dL (calc) (ref 1.9–3.7)
Glucose, Bld: 87 mg/dL (ref 65–99)
Potassium: 4.4 mmol/L (ref 3.5–5.3)
Sodium: 139 mmol/L (ref 135–146)
Total Bilirubin: 0.5 mg/dL (ref 0.2–1.2)
Total Protein: 6.5 g/dL (ref 6.1–8.1)

## 2020-01-10 LAB — VITAMIN B12: Vitamin B-12: 391 pg/mL (ref 200–1100)

## 2020-01-10 LAB — CBC WITH DIFFERENTIAL/PLATELET
Absolute Monocytes: 360 cells/uL (ref 200–950)
Basophils Absolute: 43 cells/uL (ref 0–200)
Basophils Relative: 0.7 %
Eosinophils Absolute: 128 cells/uL (ref 15–500)
Eosinophils Relative: 2.1 %
HCT: 38.6 % (ref 35.0–45.0)
Hemoglobin: 12.7 g/dL (ref 11.7–15.5)
Lymphs Abs: 2617 cells/uL (ref 850–3900)
MCH: 29.1 pg (ref 27.0–33.0)
MCHC: 32.9 g/dL (ref 32.0–36.0)
MCV: 88.3 fL (ref 80.0–100.0)
MPV: 11.8 fL (ref 7.5–12.5)
Monocytes Relative: 5.9 %
Neutro Abs: 2952 cells/uL (ref 1500–7800)
Neutrophils Relative %: 48.4 %
Platelets: 287 10*3/uL (ref 140–400)
RBC: 4.37 10*6/uL (ref 3.80–5.10)
RDW: 12.7 % (ref 11.0–15.0)
Total Lymphocyte: 42.9 %
WBC: 6.1 10*3/uL (ref 3.8–10.8)

## 2020-01-10 LAB — LIPID PANEL
Cholesterol: 174 mg/dL (ref <200)
HDL: 63 mg/dL (ref >=50)
LDL Cholesterol (Calc): 83 mg/dL (calc)
Non-HDL Cholesterol (Calc): 111 mg/dL (calc) (ref <130)
Total CHOL/HDL Ratio: 2.8 (calc) (ref <5.0)
Triglycerides: 185 mg/dL — ABNORMAL HIGH (ref <150)

## 2020-01-10 LAB — VITAMIN D 25 HYDROXY (VIT D DEFICIENCY, FRACTURES): Vit D, 25-Hydroxy: 27 ng/mL — ABNORMAL LOW (ref 30–100)

## 2020-01-10 MED ORDER — ATORVASTATIN CALCIUM 20 MG PO TABS: 20.0000 mg | ORAL_TABLET | Freq: Every day | ORAL | 3 refills | Status: DC

## 2020-01-10 MED ORDER — PANTOPRAZOLE SODIUM 20 MG PO TBEC: 20.0000 mg | DELAYED_RELEASE_TABLET | Freq: Every day | ORAL | 3 refills | Status: DC

## 2020-01-10 NOTE — Addendum Note (Signed)
Addended by: Liliane Channel on: 01/10/2020 11:26 AM   Modules accepted: Orders

## 2020-01-11 ENCOUNTER — Encounter: Payer: Self-pay | Admitting: Family Medicine

## 2020-01-19 ENCOUNTER — Inpatient Hospital Stay: Payer: PPO | Admitting: Family Medicine

## 2020-01-24 ENCOUNTER — Encounter: Payer: Self-pay | Admitting: Family Medicine

## 2020-01-30 DIAGNOSIS — R5383 Other fatigue: Secondary | ICD-10-CM | POA: Diagnosis not present

## 2020-01-30 DIAGNOSIS — Z1231 Encounter for screening mammogram for malignant neoplasm of breast: Secondary | ICD-10-CM | POA: Diagnosis not present

## 2020-01-30 DIAGNOSIS — Z01419 Encounter for gynecological examination (general) (routine) without abnormal findings: Secondary | ICD-10-CM | POA: Diagnosis not present

## 2020-01-30 DIAGNOSIS — Z6822 Body mass index (BMI) 22.0-22.9, adult: Secondary | ICD-10-CM | POA: Diagnosis not present

## 2020-02-01 ENCOUNTER — Encounter: Payer: Self-pay | Admitting: Family Medicine

## 2020-02-06 ENCOUNTER — Encounter: Payer: Self-pay | Admitting: Family Medicine

## 2020-02-09 ENCOUNTER — Other Ambulatory Visit: Payer: Self-pay

## 2020-02-09 DIAGNOSIS — Z1152 Encounter for screening for COVID-19: Secondary | ICD-10-CM

## 2020-02-14 ENCOUNTER — Other Ambulatory Visit: Payer: Self-pay

## 2020-02-14 ENCOUNTER — Other Ambulatory Visit: Payer: PPO

## 2020-02-14 DIAGNOSIS — Z1152 Encounter for screening for COVID-19: Secondary | ICD-10-CM | POA: Diagnosis not present

## 2020-02-15 LAB — SARS COV-2 SEROLOGY(COVID-19)AB(IGG,IGM),IMMUNOASSAY
SARS CoV-2 AB IgG: NEGATIVE
SARS CoV-2 IgM: NEGATIVE

## 2020-02-21 ENCOUNTER — Encounter: Payer: Self-pay | Admitting: Family Medicine

## 2020-03-07 ENCOUNTER — Encounter: Payer: Self-pay | Admitting: Family Medicine

## 2020-03-07 ENCOUNTER — Telehealth: Payer: PPO

## 2020-04-19 ENCOUNTER — Encounter: Payer: Self-pay | Admitting: Family Medicine

## 2020-04-25 ENCOUNTER — Encounter: Payer: Self-pay | Admitting: Family Medicine

## 2020-04-25 ENCOUNTER — Encounter: Payer: Self-pay | Admitting: Physician Assistant

## 2020-04-25 ENCOUNTER — Telehealth (INDEPENDENT_AMBULATORY_CARE_PROVIDER_SITE_OTHER): Payer: PPO | Admitting: Physician Assistant

## 2020-04-25 ENCOUNTER — Telehealth: Payer: PPO | Admitting: Physician Assistant

## 2020-04-25 VITALS — Ht 64.0 in | Wt 123.0 lb

## 2020-04-25 DIAGNOSIS — B349 Viral infection, unspecified: Secondary | ICD-10-CM | POA: Diagnosis not present

## 2020-04-25 DIAGNOSIS — J101 Influenza due to other identified influenza virus with other respiratory manifestations: Secondary | ICD-10-CM | POA: Diagnosis not present

## 2020-04-25 DIAGNOSIS — R509 Fever, unspecified: Secondary | ICD-10-CM | POA: Diagnosis not present

## 2020-04-25 MED ORDER — AZITHROMYCIN 250 MG PO TABS: ORAL_TABLET | ORAL | 0 refills | Status: DC

## 2020-04-25 NOTE — Progress Notes (Signed)
Virtual Visit via Video   I connected with Madison Blair on 04/25/20 at  4:00 PM EST by a video enabled telemedicine application and verified that I am speaking with the correct person using two identifiers. Location patient: Home Location provider: [facility], Office Persons participating in the virtual visit: Madison Blair, Lias PA-C, Madison Pickler, LPN   I discussed the limitations of evaluation and management by telemedicine and the availability of in person appointments. The patient expressed understanding and agreed to proceed.  I acted as a Education administrator for [employer], PA-C [employer], LPN   Subjective:   HPI:  Cough Pt c/o dry non-productive cough, headache, clear nasal drainage and fever started Monday. Fever has been 101-102. Ibuprofen holds her for 4-5 hours. Denies chills, nausea, vomiting, diarrhea, chest pain, SOB. Pt has been using Robitussin and Emergen-C.  She has received 3 doses of Pfizer vaccine.   ROS: See pertinent positives and negatives per HPI.  Patient Active Problem List   Diagnosis Date Noted  . Essential hypertension 06/07/2019  . Fever blister 03/08/2019  . 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 History of Mohs micrographic surgery for skin cancer 03/24/2018  . Vitamin D deficiency 12/30/2016  . Osteopenia 12/30/2016  . PVC's (premature ventricular contractions) 01/16/2016  . Insomnia 06/11/2015  . Mitral valve prolapse 06/11/2015  . [place] arthritis 06/11/2015  . Former smoker 06/11/2015  . Positive hepatitis C antibody test 06/29/2013  . GERD (gastroesophageal reflux disease) 06/29/2013  . Chest pain 08/01/2011  . Hyperlipidemia 08/01/2011    Social History   Tobacco Use  . Smoking status: Former Smoker    Packs/day: 1.00    Years: 30.00    Pack years: 30.00    Types: Cigarettes    Quit date: 05/19/1990    Years since quitting: 29.9  . Smokeless tobacco: Never Used  Substance Use Topics  . Alcohol use: Yes     Alcohol/week: 0.0 standard drinks    Comment: Rarely    Current Outpatient Medications:  .  amLODipine (NORVASC) 5 MG tablet, TAKE 1 TABLET BY MOUTH EVERY DAY, Disp: 90 tablet, Rfl: 3 .  aspirin EC 81 MG tablet, Take 81 mg by mouth daily. Swallow whole., Disp: , Rfl:  .  atorvastatin (LIPITOR) 20 MG tablet, Take 1 tablet (20 mg total) by mouth daily., Disp: 90 tablet, Rfl: 3 .  estradiol (ESTRACE) 1 MG tablet, Take 1.5 mg by mouth daily. , Disp: , Rfl:  .  Multiple Vitamins-Minerals (CENTRUM SILVER PO), Take by mouth., Disp: , Rfl:  .  pantoprazole (PROTONIX) 20 MG tablet, Take 1 tablet (20 mg total) by mouth daily., Disp: 90 tablet, Rfl: 3 .  valACYclovir (VALTREX) 1000 MG tablet, TAKE 2 TABLETS BY MOUTH TWICE A DAY FOR 1 DAY AT FIRST SIGN OF COLD SORE, Disp: 30 tablet, Rfl: 1 .  azithromycin (ZITHROMAX) 250 MG tablet, Take two tablets on day 1, then one daily x 4 days, Disp: 6 tablet, Rfl: 0  No Known Allergies  Objective:   VITALS: Per patient if applicable, see vitals. GENERAL: Alert, appears well and in no acute distress. HEENT: Atraumatic, conjunctiva clear, no obvious abnormalities on inspection of external nose and ears. NECK: Normal movements of the head and neck. CARDIOPULMONARY: No increased WOB. Speaking in clear sentences. I:E ratio WNL.  MS: Moves all visible extremities without noticeable abnormality. PSYCH: Pleasant and cooperative, well-groomed. Speech normal rate and rhythm. Affect is appropriate. Insight and judgement are appropriate. Attention is  focused, linear, and appropriate.  NEURO: CN grossly intact. Oriented as arrived to appointment on time with no prompting. Moves both UE equally.  SKIN: No obvious lesions, wounds, erythema, or cyanosis noted on face or hands.  Assessment and Plan:   Madison Blair was seen today for cough.  Diagnoses and all orders for this visit:  Viral illness  Other orders -     azithromycin (ZITHROMAX) 250 MG tablet; Take two tablets on  day 1, then one daily x 4 days   No red flags on discussion, patient is not in any obvious distress during our visit. Discussed progression of most viral illness, and recommended supportive care at this point in time. I did however provide pocket rx for oral azithromycin should symptoms not improve as anticipated. I discussed NOT starting this medication unless she is into day 7-10 in her illness and COVID test is negative.  Discussed over the counter supportive care options, with recommendations to push fluids and rest. Reviewed return precautions including new/worsening fever, SOB, new/worsening cough or other concerns.  Recommended need to self-quarantine and practice social distancing until symptoms resolve.  I recommend that patient follow-up if symptoms worsen or persist despite treatment x 7-10 days, sooner if needed.  I discussed the assessment and treatment plan with the patient. The patient was provided an opportunity to ask questions and all were answered. The patient agreed with the plan and demonstrated an understanding of the instructions.   The patient was advised to call back or seek an in-person evaluation if the symptoms worsen or if the condition fails to improve as anticipated.   CMA or LPN served as scribe during this visit. History, Physical, and Plan performed by medical provider. The above documentation has been reviewed and is accurate and complete.  [place], [place] 04/25/2020

## 2020-04-26 NOTE — Telephone Encounter (Signed)
Pt wants to know if she needs Tamiflu?

## 2020-05-09 ENCOUNTER — Encounter: Payer: Self-pay | Admitting: Family Medicine

## 2020-05-17 ENCOUNTER — Encounter: Payer: Self-pay | Admitting: Family Medicine

## 2020-05-23 ENCOUNTER — Ambulatory Visit (INDEPENDENT_AMBULATORY_CARE_PROVIDER_SITE_OTHER): Payer: PPO | Admitting: *Deleted

## 2020-05-23 DIAGNOSIS — Z23 Encounter for immunization: Secondary | ICD-10-CM | POA: Diagnosis not present

## 2020-06-18 DIAGNOSIS — N6009 Solitary cyst of unspecified breast: Secondary | ICD-10-CM | POA: Diagnosis not present

## 2020-06-20 DIAGNOSIS — D485 Neoplasm of uncertain behavior of skin: Secondary | ICD-10-CM | POA: Diagnosis not present

## 2020-06-20 DIAGNOSIS — Z85828 Personal history of other malignant neoplasm of skin: Secondary | ICD-10-CM | POA: Diagnosis not present

## 2020-06-20 DIAGNOSIS — C44319 Basal cell carcinoma of skin of other parts of face: Secondary | ICD-10-CM | POA: Diagnosis not present

## 2020-06-22 ENCOUNTER — Other Ambulatory Visit: Payer: Self-pay | Admitting: Obstetrics & Gynecology

## 2020-06-22 DIAGNOSIS — N62 Hypertrophy of breast: Secondary | ICD-10-CM

## 2020-07-02 ENCOUNTER — Other Ambulatory Visit: Payer: Self-pay | Admitting: Obstetrics & Gynecology

## 2020-07-02 DIAGNOSIS — N62 Hypertrophy of breast: Secondary | ICD-10-CM

## 2020-07-03 ENCOUNTER — Ambulatory Visit
Admission: RE | Admit: 2020-07-03 | Discharge: 2020-07-03 | Disposition: A | Discharge status: Home / Self Care | Payer: PPO | Source: Ambulatory Visit | Attending: Obstetrics & Gynecology | Admitting: Obstetrics & Gynecology

## 2020-07-03 ENCOUNTER — Other Ambulatory Visit: Payer: Self-pay | Admitting: Obstetrics & Gynecology

## 2020-07-03 ENCOUNTER — Telehealth: Payer: Self-pay | Admitting: Family Medicine

## 2020-07-03 ENCOUNTER — Other Ambulatory Visit: Payer: Self-pay

## 2020-07-03 DIAGNOSIS — R922 Inconclusive mammogram: Secondary | ICD-10-CM | POA: Diagnosis not present

## 2020-07-03 DIAGNOSIS — N6489 Other specified disorders of breast: Secondary | ICD-10-CM | POA: Diagnosis not present

## 2020-07-03 DIAGNOSIS — N62 Hypertrophy of breast: Secondary | ICD-10-CM

## 2020-07-03 NOTE — Telephone Encounter (Signed)
Left message for patient to call back and schedule Medicare Annual Wellness Visit (AWV) either virtually OR in office.   Last AWV 06/07/19, please schedule at anytime with [facility]Nurse Health Advisor at [facility].  This should be a 45 minute visit.

## 2020-07-09 NOTE — Progress Notes (Signed)
Phone [phone] In person visit   Subjective:   Madison Blair is a 75 y.o. year old very pleasant female patient who presents for/with See problem oriented charting Chief Complaint  Patient presents with   Hypertension    Bp check    This visit occurred during the SARS-CoV-2 public health emergency.  Safety protocols were in place, including screening questions prior to the visit, additional usage of staff PPE, and extensive cleaning of exam room while observing appropriate contact time as indicated for disinfecting solutions.   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 Swallow whole.     atorvastatin (LIPITOR) 20 MG tablet Take 1 tablet (20 mg total) by mouth daily. 90 tablet 3   Multiple Vitamins-Minerals (CENTRUM SILVER PO) Take by mouth.     pantoprazole (PROTONIX) 20 MG tablet Take 1 tablet (20 mg total) by mouth daily. 90 tablet 3   valACYclovir (VALTREX) 1000  MG tablet TAKE 2 TABLETS BY MOUTH TWICE A DAY FOR 1 DAY AT FIRST SIGN OF COLD SORE 30 tablet 1   No current facility-administered medications for this visit.     Objective:  BP 134/78    Pulse 79    Temp 98.7 F (37.1 C) (Temporal)    Ht 5\' 4"  (1.626 m)    Wt 115 lb (52.2 kg)    SpO2 95%    BMI 19.74 kg/m  Gen: NAD, resting comfortably CV: RRR no murmurs rubs or gallops Lungs: CTAB no crackles, wheeze, rhonchi Ext: no edema Skin: warm, dry    Assessment and Plan   #had flu before Christmas and it was pretty rough- recovered at home. Tested negative for covid.   #she stopped estrace through GYN- was having recurrent cysts in breast- has upcoming MRI as has dense breasts  #hypertension S: medication: Amlodipine 5Mg  at night Home readings #s:  Has not bene checking BP Readings from Last 3 Encounters:  07/10/20 134/78  01/10/20 140/70  01/03/20 120/78  A/P: Stable. Continue current medications.   #hyperlipidemia-  LDL 171.8 07/04/13 S: Medication: atorvastatin 20 mg. She prefers asa for primary prevention Lab Results  Component Value Date   CHOL 174 01/10/2020   HDL 63 01/10/2020   LDLCALC 83 01/10/2020   LDLDIRECT 135.0 01/03/2019   TRIG 185 (H) 01/10/2020   CHOLHDL 2.8 01/10/2020   A/P: impressive improvement in #s over  50%- continue current medicine  #Vitamin D deficiency S: Medication: takes vitamin D as part of multivitamin Last vitamin D Lab Results  Component Value Date   VD25OH 76 (L) 01/10/2020  A/P: suspect may still be low on vitamin D- asked her to add an additional 1000 units per day to whatever she is taking in MV as she was on that when had lows last visit   # GERD S:Medication: pantoprazole 40mg  down to 20 mg and has had upper abdominal discomfort which goes away with tums.  Probably using tums every  Other day.  A/P: she prefers lower dose even with mild symptoms- will take tums as needed- concerned about osteoporosis risk   Recommended follow up:   Keep physical in august Future Appointments  Date Time Provider [place]  07/12/2020  9:40 AM [facility] MR 2 [facility]  01/15/2021  9:40 AM Marin Olp, MD [facility]   Lab/Order associations:   ICD-10-CM   1. Essential hypertension  I10   2. Mixed hyperlipidemia  E78.2   3. Vitamin D deficiency  E55.9     No orders of the defined types were placed in this encounter.    Return precautions advised.  Garret Reddish, MD

## 2020-07-09 NOTE — Patient Instructions (Addendum)
  Health Maintenance Due  Topic Date Due  . COLONOSCOPY - please complete cologuard and mail back 02/27/2019  . TETANUS/TDAP - consider updating this at your pharmacy (cheaper there) 06/29/2020   You are eligible to schedule your annual wellness visit with our nurse specialist Otila Kluver.  Please consider scheduling this before you leave today   suspect may still be low on vitamin D- asked her to add an additional 1000 units per day to whatever she is taking in MV as she was on that when had lows last visit    Recommended follow up: keep august physical

## 2020-07-10 ENCOUNTER — Encounter: Payer: Self-pay | Admitting: Family Medicine

## 2020-07-10 ENCOUNTER — Ambulatory Visit (INDEPENDENT_AMBULATORY_CARE_PROVIDER_SITE_OTHER): Payer: PPO | Admitting: Family Medicine

## 2020-07-10 ENCOUNTER — Other Ambulatory Visit: Payer: Self-pay

## 2020-07-10 VITALS — BP 134/78 | HR 79 | Temp 98.7°F | Ht 64.0 in | Wt 115.0 lb

## 2020-07-10 DIAGNOSIS — I10 Essential (primary) hypertension: Secondary | ICD-10-CM | POA: Diagnosis not present

## 2020-07-10 DIAGNOSIS — E559 Vitamin D deficiency, unspecified: Secondary | ICD-10-CM | POA: Diagnosis not present

## 2020-07-10 DIAGNOSIS — E782 Mixed hyperlipidemia: Secondary | ICD-10-CM

## 2020-07-11 DIAGNOSIS — H5213 Myopia, bilateral: Secondary | ICD-10-CM | POA: Diagnosis not present

## 2020-07-12 ENCOUNTER — Ambulatory Visit
Admission: RE | Admit: 2020-07-12 | Discharge: 2020-07-12 | Disposition: A | Discharge status: Home / Self Care | Payer: PPO | Source: Ambulatory Visit | Attending: Obstetrics & Gynecology | Admitting: Obstetrics & Gynecology

## 2020-07-12 ENCOUNTER — Other Ambulatory Visit: Payer: Self-pay

## 2020-07-12 DIAGNOSIS — N62 Hypertrophy of breast: Secondary | ICD-10-CM

## 2020-07-19 ENCOUNTER — Other Ambulatory Visit: Payer: Self-pay | Admitting: Family Medicine

## 2020-08-06 DIAGNOSIS — C44319 Basal cell carcinoma of skin of other parts of face: Secondary | ICD-10-CM | POA: Diagnosis not present

## 2020-08-06 DIAGNOSIS — Z85828 Personal history of other malignant neoplasm of skin: Secondary | ICD-10-CM | POA: Diagnosis not present

## 2020-08-20 ENCOUNTER — Other Ambulatory Visit: Payer: Self-pay

## 2020-08-20 MED ORDER — VALACYCLOVIR HCL 1 G PO TABS: ORAL_TABLET | ORAL | 1 refills | Status: DC

## 2020-08-20 NOTE — Telephone Encounter (Signed)
Patient's address has been updated.

## 2020-08-27 DIAGNOSIS — N6002 Solitary cyst of left breast: Secondary | ICD-10-CM | POA: Diagnosis not present

## 2020-08-27 DIAGNOSIS — N611 Abscess of the breast and nipple: Secondary | ICD-10-CM | POA: Diagnosis not present

## 2020-09-11 ENCOUNTER — Other Ambulatory Visit: Payer: Self-pay | Admitting: Family Medicine

## 2020-10-02 ENCOUNTER — Other Ambulatory Visit: Payer: Self-pay | Admitting: Surgery

## 2020-10-02 DIAGNOSIS — N6001 Solitary cyst of right breast: Secondary | ICD-10-CM | POA: Diagnosis not present

## 2020-12-13 ENCOUNTER — Telehealth: Payer: Self-pay

## 2020-12-13 NOTE — Telephone Encounter (Signed)
Called and lm on pt vm tcb, mychart message has been sent also.

## 2020-12-13 NOTE — Telephone Encounter (Signed)
-----   Message from Marin Olp, MD sent at 12/13/2020 12:45 PM EDT ----- Please investigate, check in with patient and reorder if needed. thanks ----- Message ----- From: Angela Adam, CMA Sent: 08/13/2020  10:39 AM EDT To: Marin Olp, MD  Sent a mychart to see if she has done it yet. ----- Message ----- From: Marin Olp, MD Sent: 08/13/2020   9:15 AM EDT To: Angela Adam, CMA  Done yet? ----- Message ----- From: Angela Adam, CMA Sent: 06/18/2020  12:03 PM EDT To: Marin Olp, MD  Sent Mychart message following up.  ----- Message ----- From: Marin Olp, MD Sent: 06/17/2020   9:13 PM EST To: Marin Olp, MD, [employer]  Please encourage patient to complete ----- Message ----- From: Marin Olp, MD Sent: 03/17/2020  11:08 AM EST To: Marin Olp, MD, [employer]  Please encourage patient to complete cologuard ----- Message ----- From: SYSTEM Sent: 01/15/2020  12:11 AM EDT To: Marin Olp, MD

## 2020-12-17 ENCOUNTER — Telehealth: Payer: Self-pay | Admitting: Family Medicine

## 2020-12-17 DIAGNOSIS — Z1211 Encounter for screening for malignant neoplasm of colon: Secondary | ICD-10-CM | POA: Diagnosis not present

## 2020-12-17 LAB — COLOGUARD: Cologuard: NEGATIVE

## 2020-12-17 LAB — HM COLONOSCOPY

## 2020-12-17 NOTE — Telephone Encounter (Signed)
Copied from [place] [phone]. Topic: Medicare AWV >> Dec 17, 2020  1:22 PM Harris-Coley, Hannah Beat wrote: Reason for CRM: Left message for patient to schedule Annual Wellness Visit.  Please schedule with Nurse Health Advisor Charlott Rakes, RN at [facility].

## 2020-12-23 LAB — COLOGUARD: COLOGUARD: NEGATIVE

## 2020-12-25 ENCOUNTER — Encounter: Payer: Self-pay | Admitting: Family Medicine

## 2020-12-29 ENCOUNTER — Other Ambulatory Visit: Payer: Self-pay | Admitting: Family Medicine

## 2021-01-01 ENCOUNTER — Encounter: Payer: Self-pay | Admitting: Family Medicine

## 2021-01-02 ENCOUNTER — Telehealth (INDEPENDENT_AMBULATORY_CARE_PROVIDER_SITE_OTHER): Payer: PPO | Admitting: Family Medicine

## 2021-01-02 ENCOUNTER — Encounter: Payer: Self-pay | Admitting: Family Medicine

## 2021-01-02 DIAGNOSIS — B370 Candidal stomatitis: Secondary | ICD-10-CM | POA: Diagnosis not present

## 2021-01-02 MED ORDER — NYSTATIN 100000 UNIT/ML MT SUSP: 5.0000 mL | Freq: Four times a day (QID) | OROMUCOSAL | 0 refills | Status: DC

## 2021-01-02 NOTE — Progress Notes (Signed)
    I connected with Lenord Carbo on 01/02/21 at 11:20 AM EDT by video and verified that I am speaking with the correct person using two identifiers.   I discussed the limitations, risks, security and privacy concerns of performing an evaluation and management service by video and the availability of in person appointments. I also discussed with the patient that there may be a patient responsible charge related to this service. The patient expressed understanding and agreed to proceed.  Patient location: Home Provider Location: [place] Participants: Lesleigh Noe and Lenord Carbo   Subjective:     Madison Blair is a 75 y.o. female presenting for Thrush (On tongue x 1 week)     HPI  #Tongue lesion - thinks she has thrush - coating and thick sensation - no pain - using peroxyl mouth wash daily  - was told she had thrush in the past - no recent antibiotic - no pain - tongue feels thick - non-smoker - no inhalers - no recent steroids - has tried valacyclovir with no improvement  Review of Systems   Social History   Tobacco Use  Smoking Status Former   Packs/day: 1.00   Years: 30.00   Pack years: 30.00   Types: Cigarettes   Quit date: 05/19/1990   Years since quitting: 30.6  Smokeless Tobacco Never        Objective:   BP Readings from Last 3 Encounters:  07/10/20 134/78  01/10/20 140/70  01/03/20 120/78   Wt Readings from Last 3 Encounters:  07/10/20 115 lb (52.2 kg)  04/25/20 123 lb (55.8 kg)  01/10/20 124 lb 9.6 oz (56.5 kg)    There were no vitals taken for this visit.  Physical Exam Constitutional:      Appearance: Normal appearance. She is not ill-appearing.  HENT:     Head: Normocephalic and atraumatic.     Right Ear: External ear normal.     Left Ear: External ear normal.     Mouth/Throat:     Comments: Tongue with thick yellow-white plaque. Image quality is blurry and difficulty determine features.  Eyes:      Conjunctiva/sclera: Conjunctivae normal.  Pulmonary:     Effort: Pulmonary effort is normal. No respiratory distress.  Neurological:     Mental Status: She is alert. Mental status is at baseline.  Psychiatric:        Mood and Affect: Mood normal.        Behavior: Behavior normal.        Thought Content: Thought content normal.        Judgment: Judgment normal.           Assessment & Plan:   Problem List Items Addressed This Visit   None Visit Diagnoses     Oral thrush    -  Primary   Relevant Medications   nystatin (MYCOSTATIN) 100000 UNIT/ML suspension      Video is challenging for diagnosis.She has no risk factors to suggest thrush but given white plaque w/o pain or irritation reasonable to trial nystatin suspension. Advised f/u in 1 week if not improving with in-person visit for better exam.   Return if symptoms worsen or fail to improve.  Lesleigh Noe, MD

## 2021-01-14 NOTE — Progress Notes (Signed)
Phone [phone]   Subjective:  Patient presents today for their annual physical. Chief complaint-noted.   See problem oriented charting- ROS- full  review of systems was completed and negative except for: dizziness, slight hearing loss in left ear (gets wax at times)  The following were reviewed and entered/updated in epic: Past Medical History:  Diagnosis Date   Hyperlipidemia    PUD (peptic ulcer disease)    x6   Patient Active Problem List   Diagnosis Date Noted   Essential hypertension 06/07/2019    Priority: Medium   Vitamin D deficiency 12/30/2016    Priority: Medium   PVC's (premature ventricular contractions) 01/16/2016    Priority: Medium   Positive hepatitis C antibody test 06/29/2013    Priority: Medium   Hyperlipidemia 08/01/2011    Priority: Medium   Fever blister 03/08/2019    Priority: Low   Insomnia 06/11/2015    Priority: Low   Mitral valve prolapse 06/11/2015    Priority: Low   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 bladder tack that failed   APPENDECTOMY     Bladder tack     failed   TONSILLECTOMY      Family History  Problem Relation Age of Onset   Heart disease Mother        MVR; MI 68   Breast cancer Mother        in 66s   Heart disease Father        Pacemaker    Medications- reviewed and updated Current Outpatient Medications  Medication Sig Dispense Refill   amLODipine (NORVASC) 5 MG tablet TAKE 1 TABLET BY MOUTH EVERY DAY 90 tablet 3   aspirin EC 81 MG tablet Take 81 mg by mouth daily. Swallow whole.     atorvastatin (LIPITOR) 20 MG tablet Take 1 tablet (20 mg total)  by mouth daily. 90 tablet 3   estradiol (ESTRACE) 1 MG tablet Take 1.5 mg by mouth daily.     Multiple Vitamins-Minerals (CENTRUM SILVER PO) Take by mouth.     pantoprazole (PROTONIX) 20 MG tablet Take 1 tablet (20 mg total) by mouth daily. 90 tablet 3   valACYclovir (VALTREX) 1000 MG tablet TAKE 2 TABLETS BY MOUTH TWICE A DAY FOR 1 DAY AT FIRST SIGN OF COLD SORE 30 tablet 1   No current facility-administered medications for this visit.    Allergies-reviewed and updated No Known Allergies  Social History   Social History Narrative   Divorced. 2 children (daughter 64, son 1 in 2017). 2 grandchildren from daughter 2016 month old [name], mckenzie 1 month in 2017).       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 Oropharynx normal.  Mild cerumen in bilateral ears-curetted out on the left-normal tympanic membrane after removal Neck: no thyromegaly CV: RRR no murmurs rubs or gallops Lungs: CTAB no crackles, wheeze, rhonchi Abdomen: soft/nontender/nondistended/normal bowel sounds. No rebound or guarding.  Ext: no edema Skin: warm, dry Neuro: grossly normal, moves all extremities, PERRLA   Assessment and Plan   75 y.o. female presenting for annual physical.  Health Maintenance counseling: 1. Anticipatory guidance: Patient counseled regarding regular dental exams -q6 months, eye exams -yearly,  avoiding smoking and second hand smoke , limiting alcohol to 1 beverage per day .   2. Risk factor reduction:  Advised patient of need for regular exercise and diet rich and fruits and vegetables to reduce risk of heart attack and stroke. Exercise-  back to dancing - dancing Friday, every other Saturday, every Sunday, lessons another day so usually 3-4 days a week.  Diet- weight was stable, reasonably healthy diet- weight back up which I am thankful for.  Wt Readings from Last 3 Encounters:  01/15/21 124 lb 12.8 oz (56.6 kg)  07/10/20 115 lb (52.2 kg)  04/25/20 123 lb (55.8 kg)  3. Immunizations/screenings/ancillary studies DISCUSSED:  -Shingrix vaccination #1 - declined, can do at pharmacy if changes mind  -COVID-19 vaccination #4 repeat planned - recommended at this point waiting until new COVID specific variant vaccination is available -TDAP vaccination (overdue 12 year-repeat planned 06/2010) - recommended at pharmacy - Flu vaccination (last one 05/2020) -today Immunization History  Administered Date(s) Administered   Fluad Quad(high Dose 65+) 05/23/2020, 01/15/2021   Influenza-Unspecified 03/04/2017   PFIZER(Purple Top)SARS-COV-2 Vaccination 06/16/2019, 07/14/2019, 02/21/2020   Pneumococcal Conjugate-13 07/04/2013   Pneumococcal Polysaccharide-23 12/30/2016   Tdap 06/29/2010   4. Cervical cancer screening- hysterectomy for benign reasons-still does PAPs/pelvic exams with GYN per their preference 5. Breast cancer screening-  breast exam with Dr.Wein - was referred to Dr. Ninfa Linden- ultrasound negative at that time-  and mammogram 01/30/2020- plans on repeat visit in next month 6. Colon cancer screening - does this with [facility]. Will be due for repeat 10-year colonoscopy in October-she later opted to do Cologuard which was - 12/17/2020 with 3-year repeat planned 7. Skin cancer screening- followed with [facility] dermatology-had history of basal cell carcinoma on the nose and had one on her leg as well- plans to schedule follow up. advised regular sunscreen use. Denies worrisome, changing, or new skin lesions.  8. Birth control/STD check- hysterectomy-monogamous with current partner- long term boyfriend 18. Osteoporosis screening at 11-  osteopenia per GYN-followed by them and she was on centrum silver (stated had calcium and vitamin D). Had in 2020- will discuss with Dr. 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   # Oral Thrush S: Patient was seen by Dr.Cody on a video visit on 12/2020 and presented with a thrush on tongue. Had coating and thick sensation however no pain. She reported tongue felt thick. She also reported to having a thrush in the past. She used peroxyl mouth wash daily but no recent antibiotics or inhalers or steroids. She did try valacyclovir with no improvement. She is a non-smoker.  -Nystatin 100000 uni- was not abl eto get.  Later treated with diflucan as well as clotrimazole and resolved.  A/P: thankfully resolved with help of Dr. Stann Mainland-  she will let us know if recurrent.    #HRT through Dr. Stann Mainland- tried off of it due to breast cysts but did not work well for her.   #hypertension S: medication: Amlodipine '5Mg'$  at night  BP Readings from Last 3 Encounters:  01/15/21 (!) 127/57  07/10/20 134/78  01/10/20 140/70  A/P: Stable. Continue current medications.  - some dizziness in last few days- bp slightly lower. She admits could drink more fluids- encouraged her to do so and let us know if not improving  #hyperlipidemia -LDL 171.8 07/04/13 S: Medication:atorvastatin 20 mg daily She preferred ASA for primary prevention  Lab Results  Component Value Date   CHOL 174 01/10/2020   HDL 63 01/10/2020   LDLCALC 83 01/10/2020   LDLDIRECT 135.0 01/03/2019   TRIG 185 (H) 01/10/2020   CHOLHDL 2.8 01/10/2020   A/P: well controlled with over 50% reduction in LDL last year on atorvastatin 20 mg- update lipids- continue current meds for now  #Vitamin D deficiency S: Medication: takes vitamin D as part of multivitamin Last vitamin  D Lab Results  Component Value Date   VD25OH 27 (L) 01/10/2020  A/P: if low again may do a short term boost in vitamin D with high dose.    # GERD S:Medication: pantoprazole '40mg'$  down to 20 mg daily and had upper abdominal discomfort which goes away with tums.  Probably used tums every other day.   B12 levels related to PPI use: only some through [facility] Lab Results  Component Value Date   VITAMINB12 391 01/10/2020  A/P: doing well on pantoprazole - failed pepcid she reports in the past   Recommended follow up: Return in about 6 months (around 07/16/2021) for a follow-up or sooner if needed.  Lab/Order associations: Not fasting-coffee with creamer   ICD-10-CM   1. Preventative health care  Z00.00     2. Essential hypertension  I10     3. Gastroesophageal reflux disease without esophagitis  K21.9     4. Hyperlipidemia, unspecified hyperlipidemia type  E78.5 CBC with Differential/Platelet    Comprehensive metabolic panel    Lipid panel    TSH    5. Vitamin D deficiency  E55.9 VITAMIN D 25 Hydroxy (Vit-D Deficiency, Fractures)    6. Need for immunization against influenza  Z23 Flu Vaccine QUAD High Dose(Fluad)    7. High risk medication use  Z79.899 Vitamin B12     No orders of the defined types were placed in this encounter.  I,Jada Bradford,acting as a scribe for Garret Reddish, MD.,have documented all relevant documentation on the behalf of Garret Reddish, MD,as directed by  Garret Reddish, MD while in the presence of Garret Reddish, MD.   I, Garret Reddish, MD, have reviewed all documentation for this visit. The documentation on 01/15/21 for the exam, diagnosis, procedures, and orders are all accurate and complete.   Return precautions advised.  Garret Reddish, MD

## 2021-01-15 ENCOUNTER — Encounter: Payer: Self-pay | Admitting: Family Medicine

## 2021-01-15 ENCOUNTER — Ambulatory Visit (INDEPENDENT_AMBULATORY_CARE_PROVIDER_SITE_OTHER): Payer: PPO | Admitting: Family Medicine

## 2021-01-15 ENCOUNTER — Other Ambulatory Visit: Payer: Self-pay

## 2021-01-15 ENCOUNTER — Other Ambulatory Visit: Payer: Self-pay | Admitting: Family Medicine

## 2021-01-15 VITALS — BP 127/57 | HR 67 | Temp 98.6°F | Ht 64.0 in | Wt 124.8 lb

## 2021-01-15 DIAGNOSIS — Z79899 Other long term (current) drug therapy: Secondary | ICD-10-CM | POA: Diagnosis not present

## 2021-01-15 DIAGNOSIS — H9192 Unspecified hearing loss, left ear: Secondary | ICD-10-CM | POA: Diagnosis not present

## 2021-01-15 DIAGNOSIS — I10 Essential (primary) hypertension: Secondary | ICD-10-CM | POA: Diagnosis not present

## 2021-01-15 DIAGNOSIS — Z0000 Encounter for general adult medical examination without abnormal findings: Secondary | ICD-10-CM

## 2021-01-15 DIAGNOSIS — Z23 Encounter for immunization: Secondary | ICD-10-CM

## 2021-01-15 DIAGNOSIS — K219 Gastro-esophageal reflux disease without esophagitis: Secondary | ICD-10-CM | POA: Diagnosis not present

## 2021-01-15 DIAGNOSIS — E559 Vitamin D deficiency, unspecified: Secondary | ICD-10-CM

## 2021-01-15 DIAGNOSIS — E785 Hyperlipidemia, unspecified: Secondary | ICD-10-CM

## 2021-01-15 LAB — COMPREHENSIVE METABOLIC PANEL
ALT: 12 U/L (ref 0–35)
AST: 16 U/L (ref 0–37)
Albumin: 4 g/dL (ref 3.5–5.2)
Alkaline Phosphatase: 70 U/L (ref 39–117)
BUN: 13 mg/dL (ref 6–23)
CO2: 26 mEq/L (ref 19–32)
Calcium: 9.6 mg/dL (ref 8.4–10.5)
Chloride: 104 mEq/L (ref 96–112)
Creatinine, Ser: 0.67 mg/dL (ref 0.40–1.20)
GFR: 87.11 mL/min (ref >60.00)
Glucose, Bld: 85 mg/dL (ref 70–99)
Potassium: 4.7 mEq/L (ref 3.5–5.1)
Sodium: 137 mEq/L (ref 135–145)
Total Bilirubin: 0.4 mg/dL (ref 0.2–1.2)
Total Protein: 6.9 g/dL (ref 6.0–8.3)

## 2021-01-15 LAB — VITAMIN B12: Vitamin B-12: 231 pg/mL (ref 211–911)

## 2021-01-15 LAB — LIPID PANEL
Cholesterol: 195 mg/dL (ref 0–200)
HDL: 71.3 mg/dL (ref >39.00)
LDL Cholesterol: 84 mg/dL (ref 0–99)
NonHDL: 123.36
Total CHOL/HDL Ratio: 3
Triglycerides: 196 mg/dL — ABNORMAL HIGH (ref 0.0–149.0)
VLDL: 39.2 mg/dL (ref 0.0–40.0)

## 2021-01-15 LAB — CBC WITH DIFFERENTIAL/PLATELET
Basophils Absolute: 0 10*3/uL (ref 0.0–0.1)
Basophils Relative: 0.6 % (ref 0.0–3.0)
Eosinophils Absolute: 0.1 10*3/uL (ref 0.0–0.7)
Eosinophils Relative: 1.6 % (ref 0.0–5.0)
HCT: 39 % (ref 36.0–46.0)
Hemoglobin: 12.9 g/dL (ref 12.0–15.0)
Lymphocytes Relative: 33.7 % (ref 12.0–46.0)
Lymphs Abs: 1.7 10*3/uL (ref 0.7–4.0)
MCHC: 33.2 g/dL (ref 30.0–36.0)
MCV: 87.6 fl (ref 78.0–100.0)
Monocytes Absolute: 0.4 10*3/uL (ref 0.1–1.0)
Monocytes Relative: 8 % (ref 3.0–12.0)
Neutro Abs: 2.9 10*3/uL (ref 1.4–7.7)
Neutrophils Relative %: 56.1 % (ref 43.0–77.0)
Platelets: 272 10*3/uL (ref 150.0–400.0)
RBC: 4.46 Mil/uL (ref 3.87–5.11)
RDW: 14.2 % (ref 11.5–15.5)
WBC: 5.1 10*3/uL (ref 4.0–10.5)

## 2021-01-15 LAB — VITAMIN D 25 HYDROXY (VIT D DEFICIENCY, FRACTURES): VITD: 27.4 ng/mL — ABNORMAL LOW (ref 30.00–100.00)

## 2021-01-15 LAB — TSH: TSH: 2.29 u[IU]/mL (ref 0.35–5.50)

## 2021-01-15 MED ORDER — VITAMIN D (ERGOCALCIFEROL) 1.25 MG (50000 UNIT) PO CAPS: 50000.0000 [IU] | ORAL_CAPSULE | ORAL | 1 refills | Status: DC

## 2021-01-15 NOTE — Patient Instructions (Addendum)
Please stop by lab before you go If you have mychart- we will send your results within 3 business days of [place] receiving them.  If you do not have mychart- we will call you about results within 5 business days of [place] receiving them.  *please also note that you will see labs on mychart as soon as they post. I will later go in and write notes on them- will say "notes from Dr. Yong Channel"  I encourage you to drink more fluids. If symptoms do not improve or you exhibit any new or worsening symptoms, please let us know in regards to dizziness  We will call you within two weeks about your referral to ENT. If you do not hear within 2 weeks, give [place] a call.   Flu shot today- thank you!   I suggest waiting for the new strand specific COVID-19 vaccination.  Consider getting your TDAP at your local pharmacy - this will be cheaper for you.  Recommended follow up: Return in about 6 months (around 07/16/2021) for a follow-up or sooner if needed.I

## 2021-01-20 ENCOUNTER — Other Ambulatory Visit: Payer: Self-pay | Admitting: Family Medicine

## 2021-01-24 ENCOUNTER — Encounter: Payer: Self-pay | Admitting: Family Medicine

## 2021-01-31 DIAGNOSIS — Z6822 Body mass index (BMI) 22.0-22.9, adult: Secondary | ICD-10-CM | POA: Diagnosis not present

## 2021-01-31 DIAGNOSIS — Z1231 Encounter for screening mammogram for malignant neoplasm of breast: Secondary | ICD-10-CM | POA: Diagnosis not present

## 2021-01-31 DIAGNOSIS — Z01419 Encounter for gynecological examination (general) (routine) without abnormal findings: Secondary | ICD-10-CM | POA: Diagnosis not present

## 2021-02-07 ENCOUNTER — Other Ambulatory Visit: Payer: Self-pay | Admitting: Family Medicine

## 2021-03-14 ENCOUNTER — Encounter: Payer: Self-pay | Admitting: Family Medicine

## 2021-04-22 ENCOUNTER — Other Ambulatory Visit: Payer: Self-pay | Admitting: *Deleted

## 2021-04-22 MED ORDER — VITAMIN D (ERGOCALCIFEROL) 1.25 MG (50000 UNIT) PO CAPS: 50000.0000 [IU] | ORAL_CAPSULE | ORAL | 1 refills | Status: DC

## 2021-05-08 DIAGNOSIS — L821 Other seborrheic keratosis: Secondary | ICD-10-CM | POA: Diagnosis not present

## 2021-05-08 DIAGNOSIS — D225 Melanocytic nevi of trunk: Secondary | ICD-10-CM | POA: Diagnosis not present

## 2021-05-08 DIAGNOSIS — Z85828 Personal history of other malignant neoplasm of skin: Secondary | ICD-10-CM | POA: Diagnosis not present

## 2021-05-08 DIAGNOSIS — D485 Neoplasm of uncertain behavior of skin: Secondary | ICD-10-CM | POA: Diagnosis not present

## 2021-05-08 DIAGNOSIS — D1801 Hemangioma of skin and subcutaneous tissue: Secondary | ICD-10-CM | POA: Diagnosis not present

## 2021-05-08 DIAGNOSIS — L820 Inflamed seborrheic keratosis: Secondary | ICD-10-CM | POA: Diagnosis not present

## 2021-05-21 ENCOUNTER — Encounter: Payer: Self-pay | Admitting: Family Medicine

## 2021-05-22 ENCOUNTER — Other Ambulatory Visit: Payer: Self-pay

## 2021-05-22 MED ORDER — VALACYCLOVIR HCL 1 G PO TABS: ORAL_TABLET | ORAL | 1 refills | Status: DC

## 2021-05-30 ENCOUNTER — Other Ambulatory Visit: Payer: Self-pay | Admitting: Family Medicine

## 2021-06-05 ENCOUNTER — Other Ambulatory Visit: Payer: Self-pay | Admitting: Family Medicine

## 2021-06-10 ENCOUNTER — Other Ambulatory Visit: Payer: Self-pay

## 2021-06-10 ENCOUNTER — Ambulatory Visit (INDEPENDENT_AMBULATORY_CARE_PROVIDER_SITE_OTHER): Payer: PPO

## 2021-06-10 VITALS — BP 136/84 | HR 64 | Temp 98.1°F | Wt 128.0 lb

## 2021-06-10 DIAGNOSIS — Z0000 Encounter for general adult medical examination without abnormal findings: Secondary | ICD-10-CM

## 2021-06-10 NOTE — Patient Instructions (Signed)
Ms. Madison Blair , Thank you for taking time to come for your Medicare Wellness Visit. I appreciate your ongoing commitment to your health goals. Please review the following plan we discussed and let me know if I can assist you in the future.   Screening recommendations/referrals: Colonoscopy: Done 12/17/20 repeat cologuard every 3 years  Mammogram: Done 07/03/20 repeat every year  Bone Density: Done 01/19/19 repeat every 2 years  Recommended yearly ophthalmology/optometry visit for glaucoma screening and checkup Recommended yearly dental visit for hygiene and checkup  Vaccinations: Influenza vaccine: Done 01/15/21 repeat every year  Pneumococcal vaccine: Up to date Tdap vaccine: postponed until 01/15/22 Shingles vaccine: Shingrix discussed. Please contact your pharmacy for coverage information.    Covid-19:Completed 1/28, 2/25, & 02/21/20  Advanced directives: Copies in chart   Conditions/risks identified: maintain wellness   Next appointment: Follow up in one year for your annual wellness visit    Preventive Care 65 Years and Older, Female Preventive care refers to lifestyle choices and visits with your health care provider that can promote health and wellness. What does preventive care include? A yearly physical exam. This is also called an annual well check. Dental exams once or twice a year. Routine eye exams. Ask your health care provider how often you should have your eyes checked. Personal lifestyle choices, including: Daily care of your teeth and gums. Regular physical activity. Eating a healthy diet. Avoiding tobacco and drug use. Limiting alcohol use. Practicing safe sex. Taking low-dose aspirin every day. Taking vitamin and mineral supplements as recommended by your health care provider. What happens during an annual well check? The services and screenings done by your health care provider during your annual well check will depend on your age, overall health, lifestyle risk  factors, and family history of disease. Counseling  Your health care provider may ask you questions about your: Alcohol use. Tobacco use. Drug use. Emotional well-being. Home and relationship well-being. Sexual activity. Eating habits. History of falls. Memory and ability to understand (cognition). Work and work Statistician. Reproductive health. Screening  You may have the following tests or measurements: Height, weight, and BMI. Blood pressure. Lipid and cholesterol levels. These may be checked every 5 years, or more frequently if you are over 33 years old. Skin check. Lung cancer screening. You may have this screening every year starting at age 5 if you have a 30-pack-year history of smoking and currently smoke or have quit within the past 15 years. Fecal occult blood test (FOBT) of the stool. You may have this test every year starting at age 24. Flexible sigmoidoscopy or colonoscopy. You may have a sigmoidoscopy every 5 years or a colonoscopy every 10 years starting at age 62. Hepatitis C blood test. Hepatitis B blood test. Sexually transmitted disease (STD) testing. Diabetes screening. This is done by checking your blood sugar (glucose) after you have not eaten for a while (fasting). You may have this done every 1-3 years. Bone density scan. This is done to screen for osteoporosis. You may have this done starting at age 16. Mammogram. This may be done every 1-2 years. Talk to your health care provider about how often you should have regular mammograms. Talk with your health care provider about your test results, treatment options, and if necessary, the need for more tests. Vaccines  Your health care provider may recommend certain vaccines, such as: Influenza vaccine. This is recommended every year. Tetanus, diphtheria, and acellular pertussis (Tdap, Td) vaccine. You may need a Td booster every 10  years. Zoster vaccine. You may need this after age 9. Pneumococcal 13-valent  conjugate (PCV13) vaccine. One dose is recommended after age 62. Pneumococcal polysaccharide (PPSV23) vaccine. One dose is recommended after age 82. Talk to your health care provider about which screenings and vaccines you need and how often you need them. This information is not intended to replace advice given to you by your health care provider. Make sure you discuss any questions you have with your health care provider. Document Released: 06/01/2015 Document Revised: 01/23/2016 Document Reviewed: 03/06/2015 Elsevier Interactive Patient Education  2017 [place] Prevention in the Home Falls can cause injuries. They can happen to people of all ages. There are many things you can do to make your home safe and to help prevent falls. What can I do on the outside of my home? Regularly fix the edges of walkways and driveways and fix any cracks. Remove anything that might make you trip as you walk through a door, such as a raised step or threshold. Trim any bushes or trees on the path to your home. Use bright outdoor lighting. Clear any walking paths of anything that might make someone trip, such as rocks or tools. Regularly check to see if handrails are loose or broken. Make sure that both sides of any steps have handrails. Any raised decks and porches should have guardrails on the edges. Have any leaves, snow, or ice cleared regularly. Use sand or salt on walking paths during winter. Clean up any spills in your garage right away. This includes oil or grease spills. What can I do in the bathroom? Use night lights. Install grab bars by the toilet and in the tub and shower. Do not use towel bars as grab bars. Use non-skid mats or decals in the tub or shower. If you need to sit down in the shower, use a plastic, non-slip stool. Keep the floor dry. Clean up any water that spills on the floor as soon as it happens. Remove soap buildup in the tub or shower regularly. Attach bath mats  securely with double-sided non-slip rug tape. Do not have throw rugs and other things on the floor that can make you trip. What can I do in the bedroom? Use night lights. Make sure that you have a light by your bed that is easy to reach. Do not use any sheets or blankets that are too big for your bed. They should not hang down onto the floor. Have a firm chair that has side arms. You can use this for support while you get dressed. Do not have throw rugs and other things on the floor that can make you trip. What can I do in the kitchen? Clean up any spills right away. Avoid walking on wet floors. Keep items that you use a lot in easy-to-reach places. If you need to reach something above you, use a strong step stool that has a grab bar. Keep electrical cords out of the way. Do not use floor polish or wax that makes floors slippery. If you must use wax, use non-skid floor wax. Do not have throw rugs and other things on the floor that can make you trip. What can I do with my stairs? Do not leave any items on the stairs. Make sure that there are handrails on both sides of the stairs and use them. Fix handrails that are broken or loose. Make sure that handrails are as long as the stairways. Check any carpeting to make sure  that it is firmly attached to the stairs. Fix any carpet that is loose or worn. Avoid having throw rugs at the top or bottom of the stairs. If you do have throw rugs, attach them to the floor with carpet tape. Make sure that you have a light switch at the top of the stairs and the bottom of the stairs. If you do not have them, ask someone to add them for you. What else can I do to help prevent falls? Wear shoes that: Do not have high heels. Have rubber bottoms. Are comfortable and fit you well. Are closed at the toe. Do not wear sandals. If you use a stepladder: Make sure that it is fully opened. Do not climb a closed stepladder. Make sure that both sides of the stepladder  are locked into place. Ask someone to hold it for you, if possible. Clearly mark and make sure that you can see: Any grab bars or handrails. First and last steps. Where the edge of each step is. Use tools that help you move around (mobility aids) if they are needed. These include: Canes. Walkers. Scooters. Crutches. Turn on the lights when you go into a dark area. Replace any light bulbs as soon as they burn out. Set up your furniture so you have a clear path. Avoid moving your furniture around. If any of your floors are uneven, fix them. If there are any pets around you, be aware of where they are. Review your medicines with your doctor. Some medicines can make you feel dizzy. This can increase your chance of falling. Ask your doctor what other things that you can do to help prevent falls. This information is not intended to replace advice given to you by your health care provider. Make sure you discuss any questions you have with your health care provider. Document Released: 03/01/2009 Document Revised: 10/11/2015 Document Reviewed: 06/09/2014 Elsevier Interactive Patient Education  2017 [employer].

## 2021-06-10 NOTE — Progress Notes (Signed)
Subjective:   Madison Blair is a 76 y.o. female who presents for Medicare Annual (Subsequent) preventive examination.  Review of Systems     Cardiac Risk Factors include: advanced age (>2men, >28 women);hypertension;dyslipidemia     Objective:    Today's Vitals   06/10/21 1519  BP: 136/84  Pulse: 64  Temp: 98.1 F (36.7 C)  SpO2: 99%  Weight: 128 lb (58.1 kg)   Body mass index is 21.97 kg/m.  Advanced Directives 06/10/2021 06/07/2019 01/12/2019 05/04/2016  Does Patient Have a Medical Advance Directive? Yes Yes No No  Type of Advance Directive Madison Blair  Does patient want to make changes to medical advance directive? - No - Patient declined - -  Copy of Madison Blair in Chart? Yes - validated most recent copy scanned in chart (See row information) No - copy requested - -  Would patient like information on creating a medical advance directive? - - Yes (ED - Information included in AVS) -    Current Medications (verified) Outpatient Encounter Medications as of 06/10/2021  Medication Sig   aspirin EC 81 MG tablet Take 81 mg by mouth daily. Swallow whole.   atorvastatin (LIPITOR) 20 MG tablet TAKE 1 TABLET BY MOUTH EVERY DAY   Multiple Vitamins-Minerals (CENTRUM SILVER PO) Take by mouth.   valACYclovir (VALTREX) 1000 MG tablet TAKE 2 TABLETS BY MOUTH TWICE A DAY FOR 1 DAY AT FIRST SIGN OF COLD SORE   Vitamin D, Ergocalciferol, (DRISDOL) 1.25 MG (50000 UNIT) CAPS capsule Take 1 capsule (50,000 Units total) by mouth every 7 (seven) days.   amLODipine (NORVASC) 5 MG tablet TAKE 1 TABLET BY MOUTH EVERY DAY (Patient not taking: Reported on 06/10/2021)   estradiol (ESTRACE) 1 MG tablet Take 1.5 mg by mouth daily. (Patient not taking: Reported on 06/10/2021)   pantoprazole (PROTONIX) 20 MG tablet TAKE 1 TABLET BY MOUTH EVERY DAY (Patient not taking: Reported on 06/10/2021)   No facility-administered  encounter medications on file as of 06/10/2021.    Allergies (verified) Patient has no known allergies.   History: Past Medical History:  Diagnosis Date   Hyperlipidemia    PUD (peptic ulcer disease)    x6   Past Surgical History:  Procedure Laterality Date   ABDOMINAL HYSTERECTOMY     TAAH, BSO- removed cervix. bladder tack that failed   APPENDECTOMY     Bladder tack     failed   TONSILLECTOMY     Family History  Problem Relation Age of Onset   Heart disease Mother        MVR; MI 6   Breast cancer Mother        in 32s   Heart disease Father        Pacemaker   Social History   Socioeconomic History   Marital status: Divorced    Spouse name: Not on file   Number of children: 2    Years of education: Not on file   Highest education level: Not on file  Occupational History    Comment: Retired  Tobacco Use   Smoking status: Former    Packs/day: 1.00    Years: 30.00    Pack years: 30.00    Types: Cigarettes    Quit date: 05/19/1990    Years since quitting: 31.0   Smokeless tobacco: Never  Substance and Sexual Activity   Alcohol use: Not Currently    Comment: Rarely  Drug use: No   Sexual activity: Not on file  Other Topics Concern   Not on file  Social History Narrative   Divorced. 2 children (daughter 47, son 20 in 2017). 2 grandchildren from daughter 7450 month old Madison Blair, Madison Blair 1 month in 2017).    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 [employer]: dancing, shagging, traveling internationally- favorite place [place], reading   Social Determinants of Radio broadcast assistant Strain: Low Risk    Difficulty of Paying Living Expenses: Not hard at all  Food Insecurity: No Food Insecurity   Worried About Charity fundraiser in the Last Year: Never true   Arboriculturist in the Last Year: Never true  Transportation Needs: No Transportation Needs   Lack of Transportation (Medical): No   Lack of  Transportation (Non-Medical): No  Physical Activity: Sufficiently Active   Days of Exercise per Week: 4 days   Minutes of Exercise per Session: 150+ min  Stress: No Stress Concern Present   Feeling of Stress : Not at all  Social Connections: Moderately Integrated   Frequency of Communication with Friends and Family: More than three times a week   Frequency of Social Gatherings with Friends and Family: More than three times a week   Attends Religious Services: More than 4 times per year   Active Member of [employer] or Organizations: No   Attends Music therapist: More than 4 times per year   Marital Status: Divorced    Tobacco Counseling Counseling given: Not Answered   Clinical Intake:  Pre-visit preparation completed: Yes  Pain : No/denies pain     BMI - recorded: 21.97 Nutritional Status: BMI of 19-24  Normal Nutritional Risks: None Diabetes: No  How often do you need to have someone help you when you read instructions, pamphlets, or other written materials from your doctor or pharmacy?: 1 - Never  Diabetic?no  Interpreter Needed?: No  Information entered by :: Madison Rakes, LPN   Activities of Daily Living In your present state of health, do you have any difficulty performing the following activities: 06/10/2021 01/15/2021  Hearing? Tempie Donning  Comment [place]? N N  Difficulty concentrating or making decisions? N N  Walking or climbing stairs? N N  Dressing or bathing? N N  Doing errands, shopping? N N  Preparing Food and eating ? N -  Using the Toilet? N -  In the past six months, have you accidently leaked urine? N -  Do you have problems with loss of bowel control? N -  Managing your Medications? N -  Managing your Finances? N -  Housekeeping or managing your Housekeeping? N -  Some recent data might be hidden    Patient Care Team: Madison Olp, MD as PCP - General (Family Medicine) Madison Harp, MD as Consulting Physician  (Cardiology) Madison Blair, [facility] as Pharmacist (Pharmacist)  Indicate any recent Medical Services you may have received from other than [facility] providers in the past year (date may be approximate).     Assessment:   This is a routine wellness examination for Madison Blair.  Hearing/Vision screen Hearing Screening - Comments:: Pt stated HOH  Vision Screening - Comments:: Pt follows up with [facility] [place] for annual eye exams   Dietary issues and exercise activities discussed: Current Exercise Habits: Home exercise routine, Type of exercise: Other -  see comments (dances), Time (Minutes): > 60, Frequency (Times/Week): 4, Weekly Exercise (Minutes/Week): 0   Goals Addressed             This Visit's Progress    Patient Stated       Continue on the wellness pattern I'm am        Depression Screen PHQ 2/9 Scores 06/10/2021 01/15/2021 07/10/2020 01/10/2020 01/10/2020 06/07/2019 03/08/2019  PHQ - 2 Score 0 0 0 0 0 0 0  PHQ- 9 Score - - - 2 - - -    Fall Risk Fall Risk  06/10/2021 01/15/2021 07/10/2020 01/10/2020 01/10/2020  Falls in the past year? 0 0 0 0 0  Number falls in past yr: 0 0 0 0 0  Injury with Fall? 0 0 0 0 0  Risk for fall due to : Impaired vision No Fall Risks - - -  Risk for fall due to: Comment - - - - -  Follow up Falls prevention discussed Falls evaluation completed - - -    FALL RISK PREVENTION PERTAINING TO THE HOME:  Any stairs in or around the home? No  If so, are there any without handrails? No  Home free of loose throw rugs in walkways, pet beds, electrical cords, etc? Yes  Adequate lighting in your home to reduce risk of falls? Yes   ASSISTIVE DEVICES UTILIZED TO PREVENT FALLS:  Life alert? No  Use of a cane, walker or w/c? No  Grab bars in the bathroom? Yes  Shower chair or bench in shower? No  Elevated toilet seat or a handicapped toilet? No   TIMED UP AND GO:  Was the test performed? Yes .  Length of time to ambulate 10 feet: 10 sec.   Gait steady and fast without  use of assistive device  Cognitive Function:     6CIT Screen 06/10/2021  What Year? 0 points  What month? 0 points  What time? 0 points  Count back from 20 0 points  Months in reverse 0 points  Repeat phrase 2 points  Total Score 2    Immunizations Immunization History  Administered Date(s) Administered   Fluad Quad(high Dose 65+) 05/23/2020, 01/15/2021   Influenza-Unspecified 03/04/2017   PFIZER(Purple Top)SARS-COV-2 Vaccination 06/16/2019, 07/14/2019, 02/21/2020   Pneumococcal Conjugate-13 07/04/2013   Pneumococcal Polysaccharide-23 12/30/2016   Tdap 06/29/2010    TDAP status: Due, Education has been provided regarding the importance of this vaccine. Advised may receive this vaccine at local pharmacy or Health Dept. Aware to provide a copy of the vaccination record if obtained from local pharmacy or Health Dept. Verbalized acceptance and understanding.  Flu Vaccine status: Up to date  Pneumococcal vaccine status: Up to date  Covid-19 vaccine status: Completed vaccines  Qualifies for Shingles Vaccine? Yes   Zostavax completed No   Shingrix Completed?: No.    Education has been provided regarding the importance of this vaccine. Patient has been advised to call insurance company to determine out of pocket expense if they have not yet received this vaccine. Advised may also receive vaccine at local pharmacy or Health Dept. Verbalized acceptance and understanding.  Screening Tests Health Maintenance  Topic Date Due   Zoster Vaccines- Shingrix (1 of 2) Never done   COVID-19 Vaccine (4 - Booster for Pfizer series) 04/17/2020   TETANUS/TDAP  01/15/2022 (Originally 06/29/2020)   MAMMOGRAM  01/29/2022   Fecal DNA (Cologuard)  12/18/2023   Pneumonia Vaccine 70+ Years old  Completed   INFLUENZA VACCINE  Completed  DEXA SCAN  Completed   Hepatitis C Screening  Completed   HPV VACCINES  Aged Out   COLONOSCOPY (Pts 45-41yrs Insurance coverage will need to be confirmed)   Discontinued    Health Maintenance  Health Maintenance Due  Topic Date Due   Zoster Vaccines- Shingrix (1 of 2) Never done   COVID-19 Vaccine (4 - Booster for Pfizer series) 04/17/2020    Colorectal cancer screening: Type of screening: Cologuard. Completed 12/17/20. Repeat every 3 years  Mammogram status: Completed 01/30/20. Repeat every year  Bone Density status: Completed 01/19/19. Results reflect: Bone density results: OSTEOPENIA. Repeat every 2 years.  Additional Screening:  Hepatitis C Screening: Completed 07/17/15  Vision Screening: Recommended annual ophthalmology exams for early detection of glaucoma and other disorders of the eye. Is the patient up to date with their annual eye exam?  Yes  Who is the provider or what is the name of the office in which the patient attends annual eye exams? [place] [facility] If pt is not established with a provider, would they like to be referred to a provider to establish care? No .   Dental Screening: Recommended annual dental exams for proper oral hygiene  Community Resource Referral / Chronic Care Management: CRR required this visit?  No   CCM required this visit?  No      Plan:     I have personally reviewed and noted the following in the patients chart:   Medical and social history Use of alcohol, tobacco or illicit drugs  Current medications and supplements including opioid prescriptions.  Functional ability and status Nutritional status Physical activity Advanced directives List of other physicians Hospitalizations, surgeries, and ER visits in previous 12 months Vitals Screenings to include cognitive, depression, and falls Referrals and appointments  In addition, I have reviewed and discussed with patient certain preventive protocols, quality metrics, and best practice recommendations. A written personalized care plan for preventive services as well as general preventive health recommendations were provided to patient.      Willette Brace, LPN   9/32/[id number]   Nurse Notes: pt complained fungus on great toe left foot and both feet are cold and pain at times until they get warm pt has an march appt , however is trying to get in earlier .

## 2021-06-11 ENCOUNTER — Telehealth: Payer: Self-pay

## 2021-06-11 NOTE — Telephone Encounter (Signed)
Patient states she will keep 06/24/21 appt.

## 2021-06-11 NOTE — Telephone Encounter (Signed)
Madison Olp, MD  [place]; [place] to work in [place]  Wednesday at 8 20 if interested- looks like she got scheduled for 06/24/21 but wanted to see if she wanted sooner    LVM to see if patient would like to be seen on 06/12/21 at 8:20am. Per Dr. Ronney Lion note above.

## 2021-06-12 NOTE — Telephone Encounter (Signed)
Noted  

## 2021-06-17 NOTE — Progress Notes (Signed)
Phone [phone] In person visit   Subjective:   Madison Blair is a 76 y.o. year old very pleasant female patient who presents for/with See problem oriented charting Chief Complaint  Patient presents with   toes cold/hurting    Pt c/o toes hurting and being cold that has been going on 3-4 weeks. She denies numbness.    Back Pain    Pt c/o back pain that has been going for several months but has gotten worse. She denies uti, she does do a lot of dancing and feels as if it has knots ins it.    This visit occurred during the SARS-CoV-2 public health emergency.  Safety protocols were in place, including screening questions prior to the visit, additional usage of staff PPE, and extensive cleaning of exam room while observing appropriate contact time as indicated for disinfecting solutions.   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 Swallow whole.     atorvastatin (LIPITOR) 20 MG tablet TAKE 1 TABLET BY MOUTH EVERY DAY 90 tablet 1   estradiol (ESTRACE) 1 MG tablet  Take 1.5 mg by mouth daily.     Multiple Vitamins-Minerals (CENTRUM SILVER PO) Take by mouth.     pantoprazole (PROTONIX) 20 MG tablet TAKE 1 TABLET BY MOUTH EVERY DAY 90 tablet 3   tiZANidine (ZANAFLEX) 2 MG tablet Take 1 tablet (2 mg total) by mouth every 8 (eight) hours as needed for muscle spasms (do not drive for 8 hours after taking). 30 tablet 0   valACYclovir (VALTREX) 1000 MG tablet TAKE 2 TABLETS BY MOUTH TWICE A DAY FOR 1 DAY AT FIRST SIGN OF COLD SORE 90 tablet 1   Vitamin D, Ergocalciferol, (DRISDOL) 1.25 MG (50000 UNIT) CAPS capsule Take 1 capsule (50,000 Units total) by mouth every 7 (seven) days. 13 capsule 1   No current facility-administered medications for this visit.     Objective:  BP 135/80 Comment: most recent home reading and typical of home readings   Pulse 61    Temp 98.3 F (36.8 C)    Ht 5\' 4"  (1.626 m)    Wt 124 lb (56.2 kg)    SpO2 98%    BMI 21.28 kg/m  Gen: NAD, resting comfortably CV: RRR no murmurs rubs or gallops Lungs: CTAB no crackles, wheeze, rhonchi Ext: no edema Skin: warm, dry Back - Normal skin, Spine with normal alignment and no deformity.  No tenderness to vertebral  process palpation.  Paraspinous muscles are  tender and with spasm.   Range of motion is full at neck and lumbar sacral regions. Negative Straight leg raise.  No pain with FABER Neuro- no saddle anesthesia, 5/5 strength lower extremities     Assessment and Plan   #  Bilateral Lower Back pain S:several months of issues. Worse lately. Dances a lot and feels tension/knots in her back as a result . One day hurt so bad could barely bend over. No radiation into the legs that she is aware of other than left 3rd and 4th toe tingling when she wakes up or if they get cold- they hurt. 4th toe on the right will have similar sensation. Toes do not feel numb.  Pain up to 10/10 yesterday. 2 weeks ago danced 3 hours straight - seemed to really worsen after that point.  -no history of back pain. No  dysuria or polyuria.   Back hurts significantly if on feet any length of time. Ibuprofen and heating pad resolves the pain- when wears off has a more tolerable ache. At the moment 0/10 but took advil before coming in. Ibuprofen/aleve/advil has been pretty regular over last few weeks.   ROS-No saddle anesthesia, bladder incontinence, fecal incontinence, weakness in extremity, numbness in extremity. History negative for trauma, history of cancer, fever, chills, unintentional weight loss, recent bacterial infection, recent IV drug use, HIV, pain worse at night or while supine.  A/P: 76 year old rather healthy overall female who dances 4 days a week for up to several hours presents with bilateral low back pain for several months worsening in the last 2 weeks after a heavier dance session.  She has sensation of tingling and pain in fourth and fifth toe on the left foot and fourth toe on the right-I wonder if there could be an element of L5 nerve root irritation.  I recommended sports medicine referral-she does have some muscular tightness in the low back and she wanted to trial muscle relaxant and physical therapy as per staff but agrees to further work-up if needed-asked her to let me know within a few weeks if she is progressing or not or if she has new or worsening symptoms #Compression fracture could be in the differential and we discussed potential imaging but once again she wants to trial the above and then proceed with imaging with sports medicine if needed  #hypertension S: medication: Amlodipine 5Mg  at night  in past- off of it now- was getting orthostatic Home readings #s: 135/80 typically  BP Readings from Last 3 Encounters:  06/24/21 135/80  06/10/21 136/84  01/15/21 (!) 127/57  A/P: blood pressure mildly high here but controlled at home- may remain off of medicine.   #hyperlipidemia -LDL 171.8 07/04/13 S: Medication:atorvastatin 20 mg daily She preferred ASA for primary prevention  Lab  Results  Component Value Date   CHOL 195 01/15/2021   HDL 71.30 01/15/2021   LDLCALC 84 01/15/2021   LDLDIRECT 135.0 01/03/2019   TRIG 196.0 (H) 01/15/2021   CHOLHDL 3 01/15/2021   A/P:  significant improvement on atorvastatin- continue current meds- recheck at [facility]PE  #Vitamin D deficiency S: Medication: takes vitamin D as part of multivitamin- last visit gave 6 months of high dose vitamin D and recommended 2000 units daily ongoing- she reports  taking this still Last vitamin D Lab Results  Component Value Date   VD25OH 27.40 (L) 01/15/2021  A/P: suspect vitamin D is much better- likely 2000 units a day when finishes  6 months of high dose.     # GERD S:Medication: pantoprazole 40mg  down to 20 mg daily- no tums recently  B12 levels related to PPI use: recommended every other day vitamin B12 - she reports - did not stop Lab Results  Component Value Date   VITAMINB12 231 01/15/2021  A/P: well controlled reflux- continue current meds.  - b12 low normal- had not started previously. Today recommended  from avs "Start vitamin b12/cyanocobalamin 1000 mcg daily for a month then every other day should be fine"  Onychomycosis noted great toenail- she wants to monitor  Recommended follow up: Return in about 7 months (around 01/22/2022) for physical or sooner if needed. Future Appointments  Date Time Provider [place]  07/01/2021  8:00 AM Lyndee Hensen, PT [facility]  01/28/2022  8:20 AM Marin Olp, MD [facility]  06/23/2022  3:15 PM [facility] COACH [facility]    Lab/Order associations:   ICD-10-CM   1. Chronic bilateral low back pain, unspecified whether sciatica present  M54.50 Ambulatory referral to Physical Therapy   G89.29     2. Essential hypertension  I10     3. Hyperlipidemia, unspecified hyperlipidemia type  E78.5     4. Vitamin D deficiency  E55.9       Meds ordered this encounter  Medications   tiZANidine (ZANAFLEX) 2 MG tablet    Sig: Take 1  tablet (2 mg total) by mouth every 8 (eight) hours as needed for muscle spasms (do not drive for 8 hours after taking).    Dispense:  30 tablet    Refill:  0   I,Jada Bradford,acting as a scribe for Garret Reddish, MD.,have documented all relevant documentation on the behalf of Garret Reddish, MD,as directed by  Garret Reddish, MD while in the presence of Garret Reddish, MD.  I, Garret Reddish, MD, have reviewed all documentation for this visit. The documentation on 06/24/21 for the exam, diagnosis, procedures, and orders are all accurate and complete.   Return precautions advised.  Garret Reddish, MD

## 2021-06-24 ENCOUNTER — Other Ambulatory Visit: Payer: Self-pay

## 2021-06-24 ENCOUNTER — Ambulatory Visit (INDEPENDENT_AMBULATORY_CARE_PROVIDER_SITE_OTHER): Payer: PPO | Admitting: Family Medicine

## 2021-06-24 ENCOUNTER — Encounter: Payer: Self-pay | Admitting: Family Medicine

## 2021-06-24 VITALS — BP 135/80 | HR 61 | Temp 98.3°F | Ht 64.0 in | Wt 124.0 lb

## 2021-06-24 DIAGNOSIS — G8929 Other chronic pain: Secondary | ICD-10-CM

## 2021-06-24 DIAGNOSIS — I10 Essential (primary) hypertension: Secondary | ICD-10-CM | POA: Diagnosis not present

## 2021-06-24 DIAGNOSIS — K219 Gastro-esophageal reflux disease without esophagitis: Secondary | ICD-10-CM

## 2021-06-24 DIAGNOSIS — M545 Low back pain, unspecified: Secondary | ICD-10-CM

## 2021-06-24 DIAGNOSIS — E559 Vitamin D deficiency, unspecified: Secondary | ICD-10-CM

## 2021-06-24 DIAGNOSIS — E785 Hyperlipidemia, unspecified: Secondary | ICD-10-CM

## 2021-06-24 MED ORDER — TIZANIDINE HCL 2 MG PO TABS: 2.0000 mg | ORAL_TABLET | Freq: Three times a day (TID) | ORAL | 0 refills | Status: DC | PRN

## 2021-06-24 NOTE — Patient Instructions (Addendum)
Start vitamin b12/cyanocobalamin 1000 mcg daily for a month then every other day should be fine  Try physical therapy at least 1 or 2 sessions along with muscle relaxant- if this is not working I want to refer you to sports medicine.   PT you can schedule at desk before you leave. Double check with insurance about your portion for PT.   Relative rest would be reasonable - perhaps limit to 30 minutes to an hour dancing per day. If you wanted to do a 1 or 2 week pause that would be ok with me as well.   Recommended follow up: Return in about 7 months (around 01/22/2022) for physical or sooner if needed.

## 2021-07-01 ENCOUNTER — Other Ambulatory Visit: Payer: Self-pay

## 2021-07-01 ENCOUNTER — Encounter: Payer: Self-pay | Admitting: Physical Therapy

## 2021-07-01 ENCOUNTER — Encounter: Payer: Self-pay | Admitting: Family Medicine

## 2021-07-01 ENCOUNTER — Ambulatory Visit: Payer: PPO | Admitting: Physical Therapy

## 2021-07-01 DIAGNOSIS — G8929 Other chronic pain: Secondary | ICD-10-CM | POA: Diagnosis not present

## 2021-07-01 DIAGNOSIS — M545 Low back pain, unspecified: Secondary | ICD-10-CM | POA: Diagnosis not present

## 2021-07-01 NOTE — Therapy (Signed)
OUTPATIENT PHYSICAL THERAPY EVALUATION   Patient Name: Madison Blair MRN: [id number] DOB:1945-07-12, 76 y.o., female Today's Date: 07/01/2021   PT End of Session - 07/01/21 0840     Visit Number 1    Number of Visits 12    Date for PT Re-Evaluation 08/12/21    Authorization Type HTA    PT Start Time 0800    PT Stop Time 0845    PT Time Calculation (min) 45 min    Activity Tolerance Patient tolerated treatment well    Behavior During Therapy WFL for tasks assessed/performed             Past Medical History:  Diagnosis Date   Hyperlipidemia    PUD (peptic ulcer disease)    x6   Past Surgical History:  Procedure Laterality Date   ABDOMINAL HYSTERECTOMY     TAAH, BSO- removed cervix. bladder tack that failed   APPENDECTOMY     Bladder tack     failed   TONSILLECTOMY     Patient Active Problem List   Diagnosis Date Noted   Essential hypertension 06/07/2019   Fever blister 03/08/2019   History of Mohs micrographic surgery for skin cancer 03/24/2018   Vitamin D deficiency 12/30/2016   Osteopenia 12/30/2016   PVC's (premature ventricular contractions) 01/16/2016   Insomnia 06/11/2015   Mitral valve prolapse 06/11/2015   CMC arthritis 06/11/2015   Former smoker 06/11/2015   Positive hepatitis C antibody test 06/29/2013   GERD (gastroesophageal reflux disease) 06/29/2013   Chest pain 08/01/2011   Hyperlipidemia 08/01/2011    PCP: Marin Olp, MD  REFERRING PROVIDER: Marin Olp, MD  REFERRING DIAG: Low Back Pain  THERAPY DIAG:  Chronic bilateral low back pain, unspecified whether sciatica present  ONSET DATE:  6 mo ago.   SUBJECTIVE:                                                                                                                                                                                           SUBJECTIVE STATEMENT: Pt states increased pain in back for about 6 months. Worsening, soreness and "knots" in low back,  bilaterally. Notes L 3-4 toe and R 4th toe tingling in Am, then goes away,  then toes can hurt later in day as well if she does too much standing. New muscle relaxers have helped. Tylenol and Motrin also help.  Dances 3 nights/week- states no pain with dancing. Wears 1 in heel with dance shoes. No HEP for back, and no treatment thus far.   PERTINENT HISTORY:  osteopenia,   PAIN:  Are you having pain? Yes NPRS  scale:  6-9/10 Pain location: Low back Pain orientation: Right and Left  PAIN TYPE: aching Pain description: intermittent  Aggravating factors: Stiff first thing in am, prolonged standing/walking (brings on toe pain)  Relieving factors: heating pad, meds,   PRECAUTIONS: None  WEIGHT BEARING RESTRICTIONS No  FALLS:  Has patient fallen in last 6 months? No, Number of falls: 0   PLOF: Independent  PATIENT GOALS  Decreased pain    OBJECTIVE:   DIAGNOSTIC FINDINGS:  None  COGNITION:  Overall cognitive status: Within functional limits for tasks assessed    POSTURE:  Standing: thoracic/lumbar appears symmetrical Supine: ASIS, pelvic crests symmetrical, leg length even  PALPATION: Pain in bil low lumbar, and SI. Minimal pain in T or L spine with PA s, No pain into glutes.   AROM/PROM 07/01/21:  Lumbar: WFL   Hips: WNL  Ankle: WNL   LE MMT: 07/01/21:  Hips: 4/5   LUMBAR SPECIAL TESTS:  SLR Neg bil;     TODAY'S TREATMENT  Ther ex: See below for HEP   PATIENT EDUCATION:  Education details: PT POC, Exam findings, HEP Person educated: Patient Education method: Explanation, Demonstration, Tactile cues, Verbal cues, and Handouts Education comprehension: verbalized understanding, returned demonstration, verbal cues required, tactile cues required, and needs further education   HOME EXERCISE PROGRAM: Access Code: [email or web address] URL: [email or web address] Date: 07/01/2021 Prepared by: Lyndee Hensen  Exercises Supine Posterior Pelvic Tilt - 2 x daily  - 2 sets - 10 reps Single Knee to Chest Stretch - 2 x daily - 3 reps - 30 hold Supine Figure 4 Piriformis Stretch - 2 x daily - 3 reps - 30 hold Supine Lower Trunk Rotation - 2 x daily - 10 reps - 5 hold Cat Cow - 2 x daily - 2 sets - 10 reps   ASSESSMENT:  CLINICAL IMPRESSION: 07/01/21: Pt presents with primary complaint of increased pain in low back. She has increased pain in bil low lumbar/SI region, mostly with increased standing and walking. 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 She has good ability for ROM but does have weakness and instability in core. She has lack of effective HEP for her dx. Pt with decreased ability for full functional activities due to pain. Pt to benefit from skilled PT to improve deficits and pain.   Objective impairments include decreased activity tolerance, decreased mobility, difficulty walking, decreased strength, increased muscle spasms, impaired flexibility, and pain. These impairments are limiting patient from cleaning, community activity, meal prep, laundry, yard work, and shopping. Personal factors including  None  are also affecting patient's functional outcome. Patient will benefit from skilled PT to address above impairments and improve overall function.  REHAB POTENTIAL: Good  CLINICAL DECISION MAKING: Stable/uncomplicated  EVALUATION COMPLEXITY: Low   GOALS: Goals reviewed with patient? Yes   SHORT TERM GOALS:  STG Name Target Date Goal status  1 Patient will be independent with initial HEP   07/15/21 INITIAL              LONG TERM GOALS:   LTG Name Target Date 6 weeks  Goal status  1 Patient will be independent with final HEP  08/12/21 INITIAL  2 Pt to demo ability for standing and walking at least 30 min with pain in low back no greater than 2/10 and no radicular pain  08/12/21 INITIAL  3 Pt to demonstrate improved strength of hips and core to be at least 4+/5, to improve stability and  pain  08/12/21  INITIAL  4 Pt to demonstrate optimal mechanics and ability for bend, lift, squat without pain, to improve ability for IADLs.   08/12/21 INITIAL  5         PLAN: PT FREQUENCY: 2x/week  PT DURATION: 6 weeks  PLANNED INTERVENTIONS: Therapeutic exercises, Therapeutic activity, Neuro Muscular re-education, Gait training, Patient/Family education, Joint mobilization, Stair training, DME instructions, Dry Needling, Electrical stimulation, Spinal mobilization, Cryotherapy, Moist heat, Traction, Ultrasound, Ionotophoresis 4mg /ml Dexamethasone, and Manual therapy  PLAN FOR NEXT SESSION:  Progress mobility, decompression, add light core strength.    Lyndee Hensen, PT 07/01/2021, 8:58 AM

## 2021-07-03 ENCOUNTER — Encounter: Payer: Self-pay | Admitting: Family Medicine

## 2021-07-05 ENCOUNTER — Encounter: Payer: Self-pay | Admitting: Physical Therapy

## 2021-07-05 ENCOUNTER — Ambulatory Visit: Payer: PPO | Admitting: Physical Therapy

## 2021-07-05 ENCOUNTER — Encounter: Payer: PPO | Admitting: Physical Therapy

## 2021-07-05 DIAGNOSIS — G8929 Other chronic pain: Secondary | ICD-10-CM

## 2021-07-05 DIAGNOSIS — M545 Low back pain, unspecified: Secondary | ICD-10-CM

## 2021-07-05 NOTE — Therapy (Signed)
OUTPATIENT PHYSICAL THERAPY TREATMENT    Patient Name: Madison Blair MRN: [id number] DOB:Sep 04, 1945, 76 y.o., female Today's Date: 07/05/2021   PT End of Session - 07/05/21 1219     Visit Number 2    Number of Visits 12    Date for PT Re-Evaluation 08/12/21    Authorization Type HTA    PT Start Time 1022    PT Stop Time 1100    PT Time Calculation (min) 38 min    Activity Tolerance Patient tolerated treatment well    Behavior During Therapy WFL for tasks assessed/performed              Past Medical History:  Diagnosis Date   Hyperlipidemia    PUD (peptic ulcer disease)    x6   Past Surgical History:  Procedure Laterality Date   ABDOMINAL HYSTERECTOMY     TAAH, BSO- removed cervix. bladder tack that failed   APPENDECTOMY     Bladder tack     failed   TONSILLECTOMY     Patient Active Problem List   Diagnosis Date Noted   Essential hypertension 06/07/2019   Fever blister 03/08/2019   History of Mohs micrographic surgery for skin cancer 03/24/2018   Vitamin D deficiency 12/30/2016   Osteopenia 12/30/2016   PVC's (premature ventricular contractions) 01/16/2016   Insomnia 06/11/2015   Mitral valve prolapse 06/11/2015   CMC arthritis 06/11/2015   Former smoker 06/11/2015   Positive hepatitis C antibody test 06/29/2013   GERD (gastroesophageal reflux disease) 06/29/2013   Chest pain 08/01/2011   Hyperlipidemia 08/01/2011    PCP: Marin Olp, MD  REFERRING PROVIDER: Marin Olp, MD  REFERRING DIAG: Low Back Pain  THERAPY DIAG:  Chronic bilateral low back pain, unspecified whether sciatica present  ONSET DATE:  6 mo ago.   SUBJECTIVE:                                                                                                                                                                                           SUBJECTIVE STATEMENT: Feels pain is improving. Only using muscle relaxer "as needed", 1 x this week.  Radicular pain has  improved with standing.   PERTINENT HISTORY:  osteopenia,   PAIN:  Are you having pain? Yes NPRS scale:  4/10 Pain location: Low back Pain orientation: Right and Left  PAIN TYPE: aching Pain description: intermittent  Aggravating factors: Stiff first thing in am, prolonged standing/walking (brings on toe pain)  Relieving factors: heating pad, meds,   PRECAUTIONS: None  WEIGHT BEARING RESTRICTIONS No  FALLS:  Has patient fallen in last 6 months? No, Number  of falls: 0   PLOF: Independent  PATIENT GOALS  Decreased pain    OBJECTIVE:   DIAGNOSTIC FINDINGS:  None  COGNITION:  Overall cognitive status: Within functional limits for tasks assessed    POSTURE:  Standing: thoracic/lumbar appears symmetrical Supine: ASIS, pelvic crests symmetrical, leg length even  PALPATION: Pain in bil low lumbar, and SI. Minimal pain in T or L spine with PA s, No pain into glutes.   AROM/PROM 07/01/21:  Lumbar: WFL   Hips: WNL  Ankle: WNL   LE MMT: 07/01/21:  Hips: 4/5   LUMBAR SPECIAL TESTS:  SLR Neg bil;     TODAY'S TREATMENT   07/05/21: Therapeutic Exercise: Aerobic: Supine:  Bridging x 20; Supine march with TA x 20; SLR x 10 bil;  Seated:  Sit to stand with TA x 10; with band at knees x 10;  S/L:  Hip abd x 15 bil;  Standing: Stretches: SKTC 30 sec x 3; pelvic tilts x 20; Cat cow x 20; LTR x 20;  Neuromuscular Re-education: Manual Therapy: long leg distraction /lumbar on L     PATIENT EDUCATION:  Education details: Updated and reviewed HEP, discussed POC.  Person educated: Patient Education method: Explanation, Demonstration, Tactile cues, Verbal cues, and Handouts Education comprehension: verbalized understanding, returned demonstration, verbal cues required, tactile cues required, and needs further education   HOME EXERCISE PROGRAM: Access Code: [email or web address]    ASSESSMENT:  CLINICAL IMPRESSION: 07/05/21: Pt feels she is doing better, and that stretches are  helping. We discussed continuing PT for optimal outcome and increased strengthening. Pt agrees. Added strength and core stability today, pt challenged with this due to weakness, and will benefit from progressive strengthening. Improved ability for sit to stand without use of hands after education and practice today.    Objective impairments include decreased activity tolerance, decreased mobility, difficulty walking, decreased strength, increased muscle spasms, impaired flexibility, and pain. These impairments are limiting patient from cleaning, community activity, meal prep, laundry, yard work, and shopping. Personal factors including  None  are also affecting patient's functional outcome. Patient will benefit from skilled PT to address above impairments and improve overall function.  REHAB POTENTIAL: Good  CLINICAL DECISION MAKING: Stable/uncomplicated  EVALUATION COMPLEXITY: Low   GOALS: Goals reviewed with patient? Yes   SHORT TERM GOALS:  STG Name Target Date Goal status  1 Patient will be independent with initial HEP   07/15/21 INITIAL              LONG TERM GOALS:   LTG Name Target Date 6 weeks  Goal status  1 Patient will be independent with final HEP  08/12/21 INITIAL  2 Pt to demo ability for standing and walking at least 30 min with pain in low back no greater than 2/10 and no radicular pain  08/12/21 INITIAL  3 Pt to demonstrate improved strength of hips and core to be at least 4+/5, to improve stability and pain  08/12/21 INITIAL  4 Pt to demonstrate optimal mechanics and ability for bend, lift, squat without pain, to improve ability for IADLs.   08/12/21 INITIAL  5         PLAN: PT FREQUENCY: 2x/week  PT DURATION: 6 weeks  PLANNED INTERVENTIONS: Therapeutic exercises, Therapeutic activity, Neuro Muscular re-education, Gait training, Patient/Family education, Joint mobilization, Stair training, DME instructions, Dry Needling, Electrical stimulation, Spinal  mobilization, Cryotherapy, Moist heat, Traction, Ultrasound, Ionotophoresis 4mg /ml Dexamethasone, and Manual therapy  PLAN FOR NEXT SESSION:  Progress mobility, decompression, add  light core strength.   Lyndee Hensen, PT, DPT 12:22 PM  07/05/21

## 2021-07-10 ENCOUNTER — Other Ambulatory Visit: Payer: Self-pay

## 2021-07-10 ENCOUNTER — Encounter: Payer: Self-pay | Admitting: Physical Therapy

## 2021-07-10 ENCOUNTER — Ambulatory Visit: Payer: PPO | Admitting: Physical Therapy

## 2021-07-10 DIAGNOSIS — M545 Low back pain, unspecified: Secondary | ICD-10-CM

## 2021-07-10 DIAGNOSIS — G8929 Other chronic pain: Secondary | ICD-10-CM | POA: Diagnosis not present

## 2021-07-10 NOTE — Therapy (Signed)
OUTPATIENT PHYSICAL THERAPY TREATMENT    Patient Name: DOROTHYANN MOURER MRN: [id number] DOB:[birth year], 76 y.o., female Today's Date: 07/10/2021      Past Medical History:  Diagnosis Date   Hyperlipidemia    PUD (peptic ulcer disease)    x6   Past Surgical History:  Procedure Laterality Date   ABDOMINAL HYSTERECTOMY     TAAH, BSO- removed cervix. bladder tack that failed   APPENDECTOMY     Bladder tack     failed   TONSILLECTOMY     Patient Active Problem List   Diagnosis Date Noted   Essential hypertension 06/07/2019   Fever blister 03/08/2019   History of Mohs micrographic surgery for skin cancer 03/24/2018   Vitamin D deficiency 12/30/2016   Osteopenia 12/30/2016   PVC's (premature ventricular contractions) 01/16/2016   Insomnia 06/11/2015   Mitral valve prolapse 06/11/2015   CMC arthritis 06/11/2015   Former smoker 06/11/2015   Positive hepatitis C antibody test 06/29/2013   GERD (gastroesophageal reflux disease) 06/29/2013   Chest pain 08/01/2011   Hyperlipidemia 08/01/2011    PCP: Marin Olp, MD  REFERRING PROVIDER: Marin Olp, MD  REFERRING DIAG: Low Back Pain  THERAPY DIAG:  No diagnosis found.  ONSET DATE:  6 mo ago.   SUBJECTIVE:                                                                                                                                                                                           SUBJECTIVE STATEMENT: Pt states less/minimal pain. Has not had to take muscle relaxer.    PERTINENT HISTORY:  osteopenia,   PAIN:  Are you having pain? Yes NPRS scale:  4/10 Pain location: Low back Pain orientation: Right and Left  PAIN TYPE: aching Pain description: intermittent  Aggravating factors: Stiff first thing in am, prolonged standing/walking (brings on toe pain)  Relieving factors: heating pad, meds,   PRECAUTIONS: None  WEIGHT BEARING RESTRICTIONS No  FALLS:  Has patient fallen in last 6 months?  No, Number of falls: 0   PLOF: Independent  PATIENT GOALS  Decreased pain    OBJECTIVE:   DIAGNOSTIC FINDINGS:  None  COGNITION:  Overall cognitive status: Within functional limits for tasks assessed    POSTURE:  Standing: thoracic/lumbar appears symmetrical Supine: ASIS, pelvic crests symmetrical, leg length even  PALPATION: Pain in bil low lumbar, and SI. Minimal pain in T or L spine with PA s, No pain into glutes.   AROM/PROM 07/01/21:  Lumbar: WFL   Hips: WNL  Ankle: WNL   LE MMT: 07/01/21:  Hips: 4/5   LUMBAR SPECIAL TESTS:  SLR Neg bil;     TODAY'S TREATMENT   07/10/2021 Therapeutic Exercise: Aerobic: Supine:  Bridging x 20; Supine march with TA x 20; SLR  2 x 10 bil; Clams Blue TB with TA x 20;  Seated:  Sit to stand with Band at thighs  x 15;   S/L:  Hip abd x 15 bil;  Standing:  Hip abd 2x10 bil; Squats at counter x 20;  Stretches: SKTC 30 sec x 3;   LTR x 20;  Neuromuscular Re-education: Manual Therapy: Lumbar PA mobs.      07/05/21: Therapeutic Exercise: Aerobic: Supine:  Bridging x 20; Supine march with TA x 20; SLR x 10 bil;  Seated:  Sit to stand with TA x 10; with band at knees x 10;  S/L:  Hip abd x 15 bil;  Standing: Stretches: SKTC 30 sec x 3; pelvic tilts x 20; Cat cow x 20; LTR x 20;  Neuromuscular Re-education: Manual Therapy: long leg distraction /lumbar on L     PATIENT EDUCATION:  Education details: Updated and reviewed HEP,  Person educated: Patient Education method: Consulting civil engineer, Demonstration, Corporate treasurer cues, Verbal cues, and Handouts Education comprehension: verbalized understanding, returned demonstration, verbal cues required, tactile cues required, and needs further education   HOME EXERCISE PROGRAM: Access Code: [email or web address]    ASSESSMENT:  CLINICAL IMPRESSION: 07/10/21: Pt showing good improvements in pain. She is challenged with core and hip strengthening due to weakness and instability, will benefit from continued  strengthening. She has improved ability for active TA after education and practice today.    Objective impairments include decreased activity tolerance, decreased mobility, difficulty walking, decreased strength, increased muscle spasms, impaired flexibility, and pain. These impairments are limiting patient from cleaning, community activity, meal prep, laundry, yard work, and shopping. Personal factors including  None  are also affecting patient's functional outcome. Patient will benefit from skilled PT to address above impairments and improve overall function.  REHAB POTENTIAL: Good  CLINICAL DECISION MAKING: Stable/uncomplicated  EVALUATION COMPLEXITY: Low   GOALS: Goals reviewed with patient? Yes   SHORT TERM GOALS:  STG Name Target Date Goal status  1 Patient will be independent with initial HEP   07/15/21 INITIAL              LONG TERM GOALS:   LTG Name Target Date 6 weeks  Goal status  1 Patient will be independent with final HEP  08/12/21 INITIAL  2 Pt to demo ability for standing and walking at least 30 min with pain in low back no greater than 2/10 and no radicular pain  08/12/21 INITIAL  3 Pt to demonstrate improved strength of hips and core to be at least 4+/5, to improve stability and pain  08/12/21 INITIAL  4 Pt to demonstrate optimal mechanics and ability for bend, lift, squat without pain, to improve ability for IADLs.   08/12/21 INITIAL  5         PLAN: PT FREQUENCY: 2x/week  PT DURATION: 6 weeks  PLANNED INTERVENTIONS: Therapeutic exercises, Therapeutic activity, Neuro Muscular re-education, Gait training, Patient/Family education, Joint mobilization, Stair training, DME instructions, Dry Needling, Electrical stimulation, Spinal mobilization, Cryotherapy, Moist heat, Traction, Ultrasound, Ionotophoresis 4mg /ml Dexamethasone, and Manual therapy  PLAN FOR NEXT SESSION:  Progress mobility, decompression, add light core strength.   Lyndee Hensen, PT,  DPT 10:12 AM  07/10/21

## 2021-07-12 ENCOUNTER — Encounter: Payer: PPO | Admitting: Physical Therapy

## 2021-07-15 ENCOUNTER — Encounter: Payer: PPO | Admitting: Physical Therapy

## 2021-07-15 DIAGNOSIS — H43813 Vitreous degeneration, bilateral: Secondary | ICD-10-CM | POA: Diagnosis not present

## 2021-07-17 ENCOUNTER — Telehealth: Payer: Self-pay | Admitting: Family Medicine

## 2021-07-17 NOTE — Telephone Encounter (Signed)
Noted  

## 2021-07-17 NOTE — Telephone Encounter (Signed)
Patient is having car issues. She has cx all her PT appts due to unexpected car issues. She is doing all her exercises and will follow up with Dr Yong Channel on  9/12 for her physical.  ?

## 2021-07-19 ENCOUNTER — Encounter: Payer: PPO | Admitting: Physical Therapy

## 2021-07-19 ENCOUNTER — Ambulatory Visit: Payer: PPO | Admitting: Family Medicine

## 2021-07-22 ENCOUNTER — Encounter: Payer: PPO | Admitting: Physical Therapy

## 2021-07-26 ENCOUNTER — Encounter: Payer: PPO | Admitting: Physical Therapy

## 2021-07-29 ENCOUNTER — Encounter: Payer: PPO | Admitting: Physical Therapy

## 2021-08-02 ENCOUNTER — Encounter: Payer: PPO | Admitting: Physical Therapy

## 2021-08-02 ENCOUNTER — Encounter: Payer: Self-pay | Admitting: Family Medicine

## 2021-08-07 ENCOUNTER — Encounter: Payer: Self-pay | Admitting: Family Medicine

## 2021-08-15 ENCOUNTER — Other Ambulatory Visit: Payer: Self-pay | Admitting: Family Medicine

## 2021-08-28 DIAGNOSIS — H2513 Age-related nuclear cataract, bilateral: Secondary | ICD-10-CM | POA: Diagnosis not present

## 2021-08-28 DIAGNOSIS — H25013 Cortical age-related cataract, bilateral: Secondary | ICD-10-CM | POA: Diagnosis not present

## 2021-08-28 DIAGNOSIS — H25043 Posterior subcapsular polar age-related cataract, bilateral: Secondary | ICD-10-CM | POA: Diagnosis not present

## 2021-08-28 DIAGNOSIS — H18413 Arcus senilis, bilateral: Secondary | ICD-10-CM | POA: Diagnosis not present

## 2021-08-28 DIAGNOSIS — H2511 Age-related nuclear cataract, right eye: Secondary | ICD-10-CM | POA: Diagnosis not present

## 2021-08-29 ENCOUNTER — Encounter: Payer: Self-pay | Admitting: Family Medicine

## 2021-08-29 NOTE — Telephone Encounter (Signed)
See below

## 2021-09-08 ENCOUNTER — Other Ambulatory Visit: Payer: Self-pay | Admitting: Family Medicine

## 2021-09-11 ENCOUNTER — Other Ambulatory Visit: Payer: Self-pay

## 2021-09-11 ENCOUNTER — Other Ambulatory Visit: Payer: Self-pay | Admitting: Family Medicine

## 2021-09-11 MED ORDER — AMLODIPINE BESYLATE 5 MG PO TABS: 5.0000 mg | ORAL_TABLET | Freq: Every day | ORAL | 1 refills | Status: DC

## 2021-09-12 ENCOUNTER — Encounter: Payer: Self-pay | Admitting: Family Medicine

## 2021-10-01 DIAGNOSIS — H25811 Combined forms of age-related cataract, right eye: Secondary | ICD-10-CM | POA: Diagnosis not present

## 2021-10-01 DIAGNOSIS — H2511 Age-related nuclear cataract, right eye: Secondary | ICD-10-CM | POA: Diagnosis not present

## 2021-10-01 DIAGNOSIS — H2512 Age-related nuclear cataract, left eye: Secondary | ICD-10-CM | POA: Diagnosis not present

## 2021-10-08 DIAGNOSIS — H2512 Age-related nuclear cataract, left eye: Secondary | ICD-10-CM | POA: Diagnosis not present

## 2021-10-08 DIAGNOSIS — H25812 Combined forms of age-related cataract, left eye: Secondary | ICD-10-CM | POA: Diagnosis not present

## 2021-12-27 ENCOUNTER — Encounter: Payer: Self-pay | Admitting: Family Medicine

## 2022-01-28 ENCOUNTER — Ambulatory Visit (INDEPENDENT_AMBULATORY_CARE_PROVIDER_SITE_OTHER): Payer: PPO | Admitting: Family Medicine

## 2022-01-28 ENCOUNTER — Encounter: Payer: Self-pay | Admitting: Family Medicine

## 2022-01-28 VITALS — BP 136/78 | HR 63 | Temp 98.4°F | Ht 64.0 in | Wt 118.4 lb

## 2022-01-28 DIAGNOSIS — Z23 Encounter for immunization: Secondary | ICD-10-CM

## 2022-01-28 DIAGNOSIS — H9193 Unspecified hearing loss, bilateral: Secondary | ICD-10-CM

## 2022-01-28 DIAGNOSIS — Z79899 Other long term (current) drug therapy: Secondary | ICD-10-CM

## 2022-01-28 DIAGNOSIS — E559 Vitamin D deficiency, unspecified: Secondary | ICD-10-CM | POA: Diagnosis not present

## 2022-01-28 DIAGNOSIS — E782 Mixed hyperlipidemia: Secondary | ICD-10-CM

## 2022-01-28 DIAGNOSIS — I10 Essential (primary) hypertension: Secondary | ICD-10-CM | POA: Diagnosis not present

## 2022-01-28 DIAGNOSIS — Z0000 Encounter for general adult medical examination without abnormal findings: Secondary | ICD-10-CM

## 2022-01-28 LAB — CBC WITH DIFFERENTIAL/PLATELET
Basophils Absolute: 0 10*3/uL (ref 0.0–0.1)
Basophils Relative: 0.4 % (ref 0.0–3.0)
Eosinophils Absolute: 0.1 10*3/uL (ref 0.0–0.7)
Eosinophils Relative: 1.5 % (ref 0.0–5.0)
HCT: 37.9 % (ref 36.0–46.0)
Hemoglobin: 12.6 g/dL (ref 12.0–15.0)
Lymphocytes Relative: 36.6 % (ref 12.0–46.0)
Lymphs Abs: 1.7 10*3/uL (ref 0.7–4.0)
MCHC: 33.1 g/dL (ref 30.0–36.0)
MCV: 90.6 fl (ref 78.0–100.0)
Monocytes Absolute: 0.4 10*3/uL (ref 0.1–1.0)
Monocytes Relative: 8.2 % (ref 3.0–12.0)
Neutro Abs: 2.5 10*3/uL (ref 1.4–7.7)
Neutrophils Relative %: 53.3 % (ref 43.0–77.0)
Platelets: 225 10*3/uL (ref 150.0–400.0)
RBC: 4.18 Mil/uL (ref 3.87–5.11)
RDW: 14.6 % (ref 11.5–15.5)
WBC: 4.8 10*3/uL (ref 4.0–10.5)

## 2022-01-28 LAB — LIPID PANEL
Cholesterol: 228 mg/dL — ABNORMAL HIGH (ref 0–200)
HDL: 63.6 mg/dL (ref >39.00)
LDL Cholesterol: 133 mg/dL — ABNORMAL HIGH (ref 0–99)
NonHDL: 164.7
Total CHOL/HDL Ratio: 4
Triglycerides: 159 mg/dL — ABNORMAL HIGH (ref 0.0–149.0)
VLDL: 31.8 mg/dL (ref 0.0–40.0)

## 2022-01-28 LAB — COMPREHENSIVE METABOLIC PANEL
ALT: 11 U/L (ref 0–35)
AST: 16 U/L (ref 0–37)
Albumin: 3.9 g/dL (ref 3.5–5.2)
Alkaline Phosphatase: 61 U/L (ref 39–117)
BUN: 14 mg/dL (ref 6–23)
CO2: 26 mEq/L (ref 19–32)
Calcium: 9.3 mg/dL (ref 8.4–10.5)
Chloride: 106 mEq/L (ref 96–112)
Creatinine, Ser: 0.75 mg/dL (ref 0.40–1.20)
GFR: 78.77 mL/min (ref >60.00)
Glucose, Bld: 82 mg/dL (ref 70–99)
Potassium: 4.6 mEq/L (ref 3.5–5.1)
Sodium: 140 mEq/L (ref 135–145)
Total Bilirubin: 0.4 mg/dL (ref 0.2–1.2)
Total Protein: 7 g/dL (ref 6.0–8.3)

## 2022-01-28 LAB — VITAMIN D 25 HYDROXY (VIT D DEFICIENCY, FRACTURES): VITD: 34.79 ng/mL (ref 30.00–100.00)

## 2022-01-28 LAB — TSH: TSH: 1.69 u[IU]/mL (ref 0.35–5.50)

## 2022-01-28 LAB — VITAMIN B12: Vitamin B-12: 181 pg/mL — ABNORMAL LOW (ref 211–911)

## 2022-01-28 NOTE — Patient Instructions (Addendum)
Flu shot (high dose) we should have these available within a month or two but please let us know if you get at outside pharmacy.  We will call you within two weeks about your referral to ENT for hearing loss eval. If you do not hear within 2 weeks, give [place] a call.   Prevnar 20 today  Please stop by lab before you go If you have mychart- we will send your results within 3 business days of [place] receiving them.  If you do not have mychart- we will call you about results within 5 business days of [place] receiving them.  *please also note that you will see labs on mychart as soon as they post. I will later go in and write notes on them- will say "notes from Dr. Yong Channel"   Let us know when you get your [facility] vaccine at the pharmacy.  Recommended follow up: Return in about 6 months (around 07/29/2022) for followup or sooner if needed.Schedule b4 you leave.

## 2022-01-28 NOTE — Progress Notes (Signed)
Phone [phone]   Subjective:  Patient presents today for their annual physical. Chief complaint-noted.   See problem oriented charting- ROS- full  review of systems was completed and negative except for:  fatigue/low energy- asks for thyroid check, hearing loss, urinary urgency and uses pessary which helps,  imperfect sleep  The following were reviewed and entered/updated in epic: Past Medical History:  Diagnosis Date   Hyperlipidemia    PUD (peptic ulcer disease)    x6   Patient Active Problem List   Diagnosis Date Noted   Essential hypertension 06/07/2019    Priority: Medium    Vitamin D deficiency 12/30/2016    Priority: Medium    PVC's (premature ventricular contractions) 01/16/2016    Priority: Medium    Positive hepatitis C antibody test 06/29/2013    Priority: Medium    Hyperlipidemia 08/01/2011    Priority: Medium    Fever blister 03/08/2019    Priority: Low   Insomnia 06/11/2015    Priority: Low   Mitral valve prolapse 06/11/2015    Priority: Low   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 bladder tack that failed   APPENDECTOMY     Bladder tack     failed   cataract surgery     TONSILLECTOMY      Family History  Problem Relation Age of Onset   Heart disease Mother        MVR; MI 37   Breast cancer Mother        in 51s   Heart disease Father        Pacemaker    Medications- reviewed and updated Current Outpatient Medications  Medication Sig Dispense Refill   amLODipine (NORVASC) 5 MG tablet Take 1 tablet (5 mg total) by mouth daily. 90 tablet 1   aspirin EC 81 MG tablet Take 81 mg by  mouth daily. Swallow whole.     estradiol (ESTRACE) 1 MG tablet Take 1.5 mg by mouth daily.     Multiple Vitamins-Minerals (CENTRUM SILVER PO) Take by mouth.     pantoprazole (PROTONIX) 20 MG tablet TAKE 1 TABLET BY MOUTH EVERY DAY 90 tablet 3   valACYclovir (VALTREX) 1000 MG tablet TAKE 2 TABLETS BY MOUTH TWICE A DAY FOR 1 DAY AT FIRST SIGN OF COLD SORE 90 tablet 1   No current facility-administered medications for this visit.    Allergies-reviewed and updated No Known Allergies  Social History   Social History Narrative   Divorced. 2 children (daughter 37, son 43 in 2017). 2 grandchildren from daughter 2012 month old [name], mckenzie 1 month in 2017).    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 Oropharynx normal Neck: no thyromegaly CV: RRR no murmurs rubs or gallops Lungs: CTAB no crackles, wheeze, rhonchi Abdomen: soft/nontender/nondistended/normal bowel sounds. No rebound or guarding.  Ext: no edema Skin: warm, dry Neuro: grossly normal, moves all extremities, PERRLA   Assessment and Plan   76 y.o. female presenting for annual physical.  Health Maintenance counseling: 1. Anticipatory guidance: Patient counseled regarding regular dental exams -q6 months, eye exams - yearly,  avoiding smoking and second hand smoke , limiting alcohol to 1 beverage per day- under this , no illicit drugs .   2. Risk factor reduction:  Advised patient of need for regular exercise and diet rich and fruits and vegetables to reduce risk of heart attack and stroke.  Exercise-  still dancing 3-4 days a week Diet/weight management-  Advised no  further weight loss. She has tried to improve diet for her cholesterol and caused some weight loss. Appetite also lower as she has aged.  Wt Readings from Last 3 Encounters:  01/28/22 118 lb 6.4 oz (53.7 kg)  06/24/21 124 lb (56.2 kg)  06/10/21 128 lb (58.1 kg)  3. Immunizations/screenings/ancillary studies.  Discussed Shingrix at pharmacy, discussed high-dose flu shot when available, discussed COVID vaccination updated version available soon.  Discussed Prevnar 20-opts in -- and RSV  Immunization History  Administered Date(s) Administered   Fluad Quad(high Dose 65+) 05/23/2020, 01/15/2021   Influenza-Unspecified 03/04/2017   PFIZER(Purple Top)SARS-COV-2 Vaccination 06/16/2019, 07/14/2019, 02/21/2020   Pneumococcal Conjugate-13 07/04/2013   Pneumococcal Polysaccharide-23 12/30/2016   Tdap 06/29/2010  4. Cervical cancer screening- hysterectomy for benign reasons-still does Paps/pelvic exams with GYN per her preference 5. Breast cancer screening-  breast exam with Dr. Stann Mainland and mammogram 07/03/20- and has had benign cysts that has aspirated- - had consult with Dr. Ninfa Linden- who did further workup and released her. Plans on update with Dr. Stann Mainland 6. Colon cancer screening - follows with [facility] at least in the past-Cologuard 12/17/2020 with 3-year repeat planned 7. Skin cancer screening-sees [facility] dermatology.advised regular sunscreen use. Denies worrisome, changing, or new skin lesions.  8. Birth control/STD check- postmenopausal/hysterectomy. Long term partner since 2019- declines std screening 9. Osteoporosis screening at 22- osteopenia per GYN-on Centrum Silver with calcium and vitamin D- considering this year 10. 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 Azzie Almas  not do well with trying to wean off  #Hypertension-controlled on repeat on amlodipine 5 mg.   #Hyperlipidemia-off atorvastatin 20 mg daily in the past (she decided to stop- felt like it was bothering stomach- symptoms resolved off the medicine)and she prefers aspirin for primary prevention- still on this.  Over 50% reduction in LDL from peak-update lipid panel- she has tried to work on diet - if elevated perhaps trial alternate like rosuvastatin 10 mg   #Vitamin D deficiency-update vitamin D with labs-takes vitamin D as part of multivitamin   #GERD-down to pantoprazole 20 mg and takes B12 given long-term PPI use- she is only taking in [place]   #Prior back pain better with prior PT- much more tolerable. Take tizanidine off list- not using  Recommended follow up: Return in about 6 months (around 07/29/2022) for followup or sooner if needed.Schedule b4 you leave. Future Appointments  Date Time Provider Presho  06/23/2022  3:15 PM [facility]   Lab/Order associations: fasting- other than cream and sugar in coffee   ICD-10-CM   1. Preventative health care  Z00.00     2. Mixed hyperlipidemia  E78.2 Comprehensive metabolic panel    CBC with Differential/Platelet    Lipid panel    TSH    3. Essential hypertension  I10     4. Vitamin D deficiency  E55.9 VITAMIN D 25 Hydroxy (Vit-D Deficiency, Fractures)    5. High risk medication use  Z79.899 Vitamin B12    6. Bilateral hearing loss, unspecified hearing loss type  H91.93 Ambulatory referral to ENT     No orders of the defined types were placed in this encounter.  Return precautions advised.  Garret Reddish, MD

## 2022-01-28 NOTE — Addendum Note (Signed)
Addended by: Clyde Lundborg A on: 01/28/2022 08:44 AM   Modules accepted: Orders

## 2022-01-29 ENCOUNTER — Other Ambulatory Visit: Payer: Self-pay

## 2022-01-29 ENCOUNTER — Encounter: Payer: Self-pay | Admitting: Family Medicine

## 2022-01-29 MED ORDER — ROSUVASTATIN CALCIUM 10 MG PO TABS
10.0000 mg | ORAL_TABLET | Freq: Every day | ORAL | 3 refills | Status: DC
  Filled 2022-06-23 – 2022-07-25 (×2): qty 90, 90d supply, fill #0
  Filled 2022-10-29: qty 90, 90d supply, fill #1

## 2022-01-29 MED ORDER — CYANOCOBALAMIN 1000 MCG/ML IJ SOLN: INTRAMUSCULAR | 3 refills | Status: DC

## 2022-01-30 ENCOUNTER — Encounter: Payer: Self-pay | Admitting: Family Medicine

## 2022-01-30 ENCOUNTER — Other Ambulatory Visit: Payer: Self-pay

## 2022-01-30 MED ORDER — "BD SYRINGE/NEEDLE 25G X 5/8"" 1 ML MISC": 0 refills | Status: DC

## 2022-02-13 ENCOUNTER — Other Ambulatory Visit: Payer: Self-pay | Admitting: Family Medicine

## 2022-03-05 ENCOUNTER — Ambulatory Visit (INDEPENDENT_AMBULATORY_CARE_PROVIDER_SITE_OTHER): Payer: PPO

## 2022-03-05 DIAGNOSIS — Z23 Encounter for immunization: Secondary | ICD-10-CM

## 2022-03-12 ENCOUNTER — Other Ambulatory Visit: Payer: Self-pay | Admitting: Family Medicine

## 2022-03-19 ENCOUNTER — Encounter: Payer: Self-pay | Admitting: Family Medicine

## 2022-03-19 ENCOUNTER — Other Ambulatory Visit: Payer: Self-pay

## 2022-03-19 DIAGNOSIS — H9190 Unspecified hearing loss, unspecified ear: Secondary | ICD-10-CM

## 2022-03-20 ENCOUNTER — Ambulatory Visit: Payer: PPO | Admitting: Audiologist

## 2022-04-02 ENCOUNTER — Encounter: Payer: Self-pay | Admitting: Family Medicine

## 2022-04-13 ENCOUNTER — Encounter: Payer: Self-pay | Admitting: Family Medicine

## 2022-04-14 NOTE — Telephone Encounter (Signed)
Spoke with pt and pt stated she is not longer having any symptoms. Advised pt to call if symptoms return.

## 2022-04-21 ENCOUNTER — Other Ambulatory Visit: Payer: Self-pay | Admitting: Family Medicine

## 2022-05-30 DIAGNOSIS — L814 Other melanin hyperpigmentation: Secondary | ICD-10-CM | POA: Diagnosis not present

## 2022-05-30 DIAGNOSIS — L578 Other skin changes due to chronic exposure to nonionizing radiation: Secondary | ICD-10-CM | POA: Diagnosis not present

## 2022-05-30 DIAGNOSIS — D2262 Melanocytic nevi of left upper limb, including shoulder: Secondary | ICD-10-CM | POA: Diagnosis not present

## 2022-05-30 DIAGNOSIS — Z85828 Personal history of other malignant neoplasm of skin: Secondary | ICD-10-CM | POA: Diagnosis not present

## 2022-05-30 DIAGNOSIS — L570 Actinic keratosis: Secondary | ICD-10-CM | POA: Diagnosis not present

## 2022-05-30 DIAGNOSIS — L820 Inflamed seborrheic keratosis: Secondary | ICD-10-CM | POA: Diagnosis not present

## 2022-05-30 DIAGNOSIS — D1801 Hemangioma of skin and subcutaneous tissue: Secondary | ICD-10-CM | POA: Diagnosis not present

## 2022-05-30 DIAGNOSIS — D485 Neoplasm of uncertain behavior of skin: Secondary | ICD-10-CM | POA: Diagnosis not present

## 2022-05-30 DIAGNOSIS — L821 Other seborrheic keratosis: Secondary | ICD-10-CM | POA: Diagnosis not present

## 2022-06-05 ENCOUNTER — Telehealth: Payer: Self-pay | Admitting: Family Medicine

## 2022-06-05 NOTE — Telephone Encounter (Signed)
error 

## 2022-06-19 ENCOUNTER — Encounter: Payer: Self-pay | Admitting: Family Medicine

## 2022-06-23 ENCOUNTER — Ambulatory Visit (INDEPENDENT_AMBULATORY_CARE_PROVIDER_SITE_OTHER): Payer: PPO

## 2022-06-23 ENCOUNTER — Other Ambulatory Visit (HOSPITAL_BASED_OUTPATIENT_CLINIC_OR_DEPARTMENT_OTHER): Payer: Self-pay

## 2022-06-23 VITALS — BP 122/74 | HR 73 | Temp 97.6°F | Wt 119.6 lb

## 2022-06-23 DIAGNOSIS — Z0000 Encounter for general adult medical examination without abnormal findings: Secondary | ICD-10-CM

## 2022-06-23 DIAGNOSIS — L988 Other specified disorders of the skin and subcutaneous tissue: Secondary | ICD-10-CM | POA: Diagnosis not present

## 2022-06-23 DIAGNOSIS — Z85828 Personal history of other malignant neoplasm of skin: Secondary | ICD-10-CM | POA: Diagnosis not present

## 2022-06-23 DIAGNOSIS — D485 Neoplasm of uncertain behavior of skin: Secondary | ICD-10-CM | POA: Diagnosis not present

## 2022-06-23 MED FILL — Pantoprazole Sodium EC Tab 20 MG (Base Equiv): ORAL | 90 days supply | Qty: 90 | Fill #0 | Status: CN

## 2022-06-23 NOTE — Patient Instructions (Signed)
Madison Blair , Thank you for taking time to come for your Medicare Wellness Visit. I appreciate your ongoing commitment to your health goals. Please review the following plan we discussed and let me know if I can assist you in the future.   These are the goals we discussed:  Goals      Patient Stated     Continue on the wellness pattern I'm am         This is a list of the screening recommended for you and due dates:  Health Maintenance  Topic Date Due   Zoster (Shingles) Vaccine (1 of 2) Never done   DTaP/Tdap/Td vaccine (2 - Td or Tdap) 06/29/2020   COVID-19 Vaccine (4 - 2023-24 season) 01/17/2022   Mammogram  01/29/2022   Medicare Annual Wellness Visit  06/24/2023   Cologuard (Stool DNA test)  12/18/2023   Pneumonia Vaccine  Completed   Flu Shot  Completed   DEXA scan (bone density measurement)  Completed   Hepatitis C Screening: USPSTF Recommendation to screen - Ages 34-79 yo.  Completed   HPV Vaccine  Aged Out   Colon Cancer Screening  Discontinued    Advanced directives: copies in chart  Conditions/risks identified: none at this time   Next appointment: Follow up in one year for your annual wellness visit    Preventive Care 65 Years and Older, Female Preventive care refers to lifestyle choices and visits with your health care provider that can promote health and wellness. What does preventive care include? A yearly physical exam. This is also called an annual well check. Dental exams once or twice a year. Routine eye exams. Ask your health care provider how often you should have your eyes checked. Personal lifestyle choices, including: Daily care of your teeth and gums. Regular physical activity. Eating a healthy diet. Avoiding tobacco and drug use. Limiting alcohol use. Practicing safe sex. Taking low-dose aspirin every day. Taking vitamin and mineral supplements as recommended by your health care provider. What happens during an annual well check? The  services and screenings done by your health care provider during your annual well check will depend on your age, overall health, lifestyle risk factors, and family history of disease. Counseling  Your health care provider may ask you questions about your: Alcohol use. Tobacco use. Drug use. Emotional well-being. Home and relationship well-being. Sexual activity. Eating habits. History of falls. Memory and ability to understand (cognition). Work and work Statistician. Reproductive health. Screening  You may have the following tests or measurements: Height, weight, and BMI. Blood pressure. Lipid and cholesterol levels. These may be checked every 5 years, or more frequently if you are over 20 years old. Skin check. Lung cancer screening. You may have this screening every year starting at age 85 if you have a 30-pack-year history of smoking and currently smoke or have quit within the past 15 years. Fecal occult blood test (FOBT) of the stool. You may have this test every year starting at age 56. Flexible sigmoidoscopy or colonoscopy. You may have a sigmoidoscopy every 5 years or a colonoscopy every 10 years starting at age 42. Hepatitis C blood test. Hepatitis B blood test. Sexually transmitted disease (STD) testing. Diabetes screening. This is done by checking your blood sugar (glucose) after you have not eaten for a while (fasting). You may have this done every 1-3 years. Bone density scan. This is done to screen for osteoporosis. You may have this done starting at age 60. Mammogram. This may  be done every 1-2 years. Talk to your health care provider about how often you should have regular mammograms. Talk with your health care provider about your test results, treatment options, and if necessary, the need for more tests. Vaccines  Your health care provider may recommend certain vaccines, such as: Influenza vaccine. This is recommended every year. Tetanus, diphtheria, and acellular  pertussis (Tdap, Td) vaccine. You may need a Td booster every 10 years. Zoster vaccine. You may need this after age 37. Pneumococcal 13-valent conjugate (PCV13) vaccine. One dose is recommended after age 58. Pneumococcal polysaccharide (PPSV23) vaccine. One dose is recommended after age 8. Talk to your health care provider about which screenings and vaccines you need and how often you need them. This information is not intended to replace advice given to you by your health care provider. Make sure you discuss any questions you have with your health care provider. Document Released: 06/01/2015 Document Revised: 01/23/2016 Document Reviewed: 03/06/2015 Elsevier Interactive Patient Education  2017 [place] Prevention in the Home Falls can cause injuries. They can happen to people of all ages. There are many things you can do to make your home safe and to help prevent falls. What can I do on the outside of my home? Regularly fix the edges of walkways and driveways and fix any cracks. Remove anything that might make you trip as you walk through a door, such as a raised step or threshold. Trim any bushes or trees on the path to your home. Use bright outdoor lighting. Clear any walking paths of anything that might make someone trip, such as rocks or tools. Regularly check to see if handrails are loose or broken. Make sure that both sides of any steps have handrails. Any raised decks and porches should have guardrails on the edges. Have any leaves, snow, or ice cleared regularly. Use sand or salt on walking paths during winter. Clean up any spills in your garage right away. This includes oil or grease spills. What can I do in the bathroom? Use night lights. Install grab bars by the toilet and in the tub and shower. Do not use towel bars as grab bars. Use non-skid mats or decals in the tub or shower. If you need to sit down in the shower, use a plastic, non-slip stool. Keep the floor  dry. Clean up any water that spills on the floor as soon as it happens. Remove soap buildup in the tub or shower regularly. Attach bath mats securely with double-sided non-slip rug tape. Do not have throw rugs and other things on the floor that can make you trip. What can I do in the bedroom? Use night lights. Make sure that you have a light by your bed that is easy to reach. Do not use any sheets or blankets that are too big for your bed. They should not hang down onto the floor. Have a firm chair that has side arms. You can use this for support while you get dressed. Do not have throw rugs and other things on the floor that can make you trip. What can I do in the kitchen? Clean up any spills right away. Avoid walking on wet floors. Keep items that you use a lot in easy-to-reach places. If you need to reach something above you, use a strong step stool that has a grab bar. Keep electrical cords out of the way. Do not use floor polish or wax that makes floors slippery. If you must use  wax, use non-skid floor wax. Do not have throw rugs and other things on the floor that can make you trip. What can I do with my stairs? Do not leave any items on the stairs. Make sure that there are handrails on both sides of the stairs and use them. Fix handrails that are broken or loose. Make sure that handrails are as long as the stairways. Check any carpeting to make sure that it is firmly attached to the stairs. Fix any carpet that is loose or worn. Avoid having throw rugs at the top or bottom of the stairs. If you do have throw rugs, attach them to the floor with carpet tape. Make sure that you have a light switch at the top of the stairs and the bottom of the stairs. If you do not have them, ask someone to add them for you. What else can I do to help prevent falls? Wear shoes that: Do not have high heels. Have rubber bottoms. Are comfortable and fit you well. Are closed at the toe. Do not wear  sandals. If you use a stepladder: Make sure that it is fully opened. Do not climb a closed stepladder. Make sure that both sides of the stepladder are locked into place. Ask someone to hold it for you, if possible. Clearly mark and make sure that you can see: Any grab bars or handrails. First and last steps. Where the edge of each step is. Use tools that help you move around (mobility aids) if they are needed. These include: Canes. Walkers. Scooters. Crutches. Turn on the lights when you go into a dark area. Replace any light bulbs as soon as they burn out. Set up your furniture so you have a clear path. Avoid moving your furniture around. If any of your floors are uneven, fix them. If there are any pets around you, be aware of where they are. Review your medicines with your doctor. Some medicines can make you feel dizzy. This can increase your chance of falling. Ask your doctor what other things that you can do to help prevent falls. This information is not intended to replace advice given to you by your health care provider. Make sure you discuss any questions you have with your health care provider. Document Released: 03/01/2009 Document Revised: 10/11/2015 Document Reviewed: 06/09/2014 Elsevier Interactive Patient Education  2017 [employer].

## 2022-06-23 NOTE — Progress Notes (Deleted)
Subjective:   ELINORE HEFFLER is a 77 y.o. female who presents for Medicare Annual (Subsequent) preventive examination.  Review of Systems     Cardiac Risk Factors include: advanced age (>22mn, >>67women);dyslipidemia;hypertension     Objective:    Today's Vitals   06/23/22 1518  BP: 122/74  Pulse: 73  Temp: 97.6 F (36.4 C)  SpO2: 98%  Weight: 119 lb 9.6 oz (54.3 kg)   Body mass index is 20.53 kg/m.     06/23/2022    3:30 PM 07/01/2021    8:13 AM 06/10/2021    3:32 PM 06/07/2019    3:17 PM 01/12/2019    1:23 PM 05/04/2016    1:37 PM  Advanced Directives  Does Patient Have a Medical Advance Directive? Yes Yes Yes Yes No No  Type of [place]   Does patient want to make changes to medical advance directive? No - Patient declined No - Patient declined  No - Patient declined    Copy of [place] Chart? Yes - validated most recent copy scanned in chart (See row information) Yes - validated most recent copy scanned in chart (See row information) Yes - validated most recent copy scanned in chart (See row information) No - copy requested    Would patient like information on creating a medical advance directive?     Yes (ED - Information included in AVS)     Current Medications (verified) Outpatient Encounter Medications as of 06/23/2022  Medication Sig   amLODipine (NORVASC) 5 MG tablet TAKE 1 TABLET (5 MG TOTAL) BY MOUTH DAILY.   aspirin EC 81 MG tablet Take 81 mg by mouth daily. Swallow whole.   cyanocobalamin (VITAMIN B12) 1000 MCG/ML injection 1000 MCG INJECTION ONCE PER WEEK FOR FOUR WEEKS, FOLLOWED BY 1000 MCG INJECTION ONCE PER MONTH.   estradiol (ESTRACE) 1 MG tablet Take 1.5 mg by mouth daily.   Multiple Vitamins-Minerals (CENTRUM SILVER PO) Take by mouth.   pantoprazole (PROTONIX) 20 MG  tablet TAKE 1 TABLET BY MOUTH EVERY DAY   rosuvastatin (CRESTOR) 10 MG tablet Take 1 tablet (10 mg total) by mouth daily.   SYRINGE/NEEDLE, DISP, 1 ML (B-D SYRINGE/NEEDLE 1CC/25GX5/8) 25G X 5/8" 1 ML MISC Use to injecti vitamin b12.   valACYclovir (VALTREX) 1000 MG tablet TAKE 2 TABLETS BY MOUTH TWICE A DAY FOR 1 DAY AT FIRST SIGN OF COLD SORE   No facility-administered encounter medications on file as of 06/23/2022.    Allergies (verified) Patient has no known allergies.   History: Past Medical History:  Diagnosis Date   Hyperlipidemia    PUD (peptic ulcer disease)    x6   Past Surgical History:  Procedure Laterality Date   ABDOMINAL HYSTERECTOMY     TAAH, BSO- removed cervix. bladder tack that failed   APPENDECTOMY     Bladder tack     failed   cataract surgery     TONSILLECTOMY     Family History  Problem Relation Age of Onset   Heart disease Mother        MVR; MI 867  Breast cancer Mother        in 542s  Heart disease Father        Pacemaker   Social History   Socioeconomic History   Marital status: Divorced    Spouse name: Not on  file   Number of children: 2    Years of education: Not on file   Highest education level: Not on file  Occupational History    Comment: Retired  Tobacco Use   Smoking status: Former    Packs/day: 1.00    Years: 30.00    Total pack years: 30.00    Types: Cigarettes    Quit date: 05/19/1990    Years since quitting: 32.1   Smokeless tobacco: Never  Substance and Sexual Activity   Alcohol use: Not Currently    Comment: Rarely   Drug use: No   Sexual activity: Not on file  Other Topics Concern   Not on file  Social History Narrative   Divorced. 2 children (daughter 58, son 40 in 2017). 2 grandchildren from daughter 6155 month old [name], mckenzie 1 month in 2017).    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 [employer]: dancing, [place], traveling internationally- favorite place  [place], reading   Social Determinants of [place] Strain: Low Risk  (06/23/2022)   Overall Financial Resource Strain (CARDIA)    Difficulty of Paying Living Expenses: Not hard at all  Food Insecurity: No Food Insecurity (06/23/2022)   Hunger Vital Sign    Worried About Running Out of Food in the Last Year: Never true    [place] in the Last Year: Never true  Transportation Needs: No Transportation Needs (06/23/2022)   PRAPARE - Hydrologist (Medical): No    Lack of Transportation (Non-Medical): No  Physical Activity: Sufficiently Active (06/23/2022)   Exercise Vital Sign    Days of Exercise per Week: 5 days    Minutes of Exercise per Session: 60 min  Stress: No Stress Concern Present (06/23/2022)   [place]    Feeling of Stress : Not at all  Social Connections: Moderately Integrated (06/23/2022)   Social Connection and Isolation Panel [NHANES]    Frequency of Communication with Friends and Family: More than three times a week    Frequency of Social Gatherings with Friends and Family: More than three times a week    Attends Religious Services: More than 4 times per year    Active Member of [employer] or Organizations: Yes    Attends Music therapist: More than 4 times per year    Marital Status: Divorced    Tobacco Counseling Counseling given: Not Answered   Clinical Intake:  Pre-visit preparation completed: Yes  Pain : No/denies pain     BMI - recorded: 20.53 Nutritional Status: BMI of 19-24  Normal Nutritional Risks: None Diabetes: No  How often do you need to have someone help you when you read instructions, pamphlets, or other written materials from your doctor or pharmacy?: 1 - Never  Diabetic?no  Interpreter Needed?: No  Information entered by :: Charlott Rakes, LPN   Activities of Daily Living    06/23/2022    3:30 PM 06/18/2022    4:48  PM  In your present state of health, do you have any difficulty performing the following activities:  Hearing? 1 1  Comment slight loss   Vision? 0 0  Difficulty concentrating or making decisions? 0 0  Walking or climbing stairs? 0 0  Dressing or bathing? 0 0  Doing errands, shopping? 0 0  Preparing Food and  eating ? N N  Using the Toilet? N N  In the past six months, have you accidently leaked urine? N Y  Do you have problems with loss of bowel control? N N  Managing your Medications? N N  Managing your Finances? N N  Housekeeping or managing your Housekeeping? N N    Patient Care Team: Marin Olp, MD as PCP - General (Family Medicine) Lorretta Harp, MD as Consulting Physician (Cardiology) Madelin Rear, [facility] (Inactive) as Pharmacist (Pharmacist)  Indicate any recent Medical Services you may have received from other than [facility] providers in the past year (date may be approximate).     Assessment:   This is a routine wellness examination for Siddhi.  Hearing/Vision screen Hearing Screening - Comments:: Pt stated slight hearing loss  Vision Screening - Comments:: Pt follows up with Dr [place] for annual eye exams   Dietary issues and exercise activities discussed: Current Exercise Habits: Home exercise routine, Type of exercise: Other - see comments (shag dance), Time (Minutes): 60, Frequency (Times/Week): 5, Weekly Exercise (Minutes/Week): 300   Goals Addressed             This Visit's Progress    Patient Stated       Gain a little        Depression Screen    06/23/2022    3:28 PM 06/10/2021    3:31 PM 01/15/2021    9:32 AM 07/10/2020   12:50 PM 01/10/2020   10:37 AM 01/10/2020   10:36 AM 06/07/2019    3:18 PM  PHQ 2/9 Scores  PHQ - 2 Score 0 0 0 0 0 0 0  PHQ- 9 Score     2      Fall Risk    06/18/2022    4:48 PM 06/10/2021    3:33 PM 01/15/2021    9:32 AM 07/10/2020   12:50 PM 01/10/2020   10:39 AM  Fall Risk   Falls in the past year? 0 0 0 0 0  Number  falls in past yr: 0 0 0 0 0  Injury with Fall? 0 0 0 0 0  Risk for fall due to : Impaired vision Impaired vision No Fall Risks    Follow up Falls prevention discussed Falls prevention discussed Falls evaluation completed      FALL RISK PREVENTION PERTAINING TO THE HOME:  Any stairs in or around the home? No  If so, are there any without handrails? No  Home free of loose throw rugs in walkways, pet beds, electrical cords, etc? Yes  Adequate lighting in your home to reduce risk of falls? Yes   ASSISTIVE DEVICES UTILIZED TO PREVENT FALLS:  Life alert? No  Use of a cane, walker or w/c? No  Grab bars in the bathroom? Yes  Shower chair or bench in shower? No  Elevated toilet seat or a handicapped toilet? No   TIMED UP AND GO:  Was the test performed? Yes .  Length of time to ambulate 10 feet: 10 sec.   Gait steady and fast without use of assistive device  Cognitive Function:        06/23/2022    3:32 PM 06/10/2021    3:35 PM  6CIT Screen  What Year? 0 points 0 points  What month? 0 points 0 points  What time? 0 points 0 points  Count back from 20 0 points 0 points  Months in reverse 0 points 0 points  Repeat phrase 0 points 2  points  Total Score 0 points 2 points    Immunizations Immunization History  Administered Date(s) Administered   Fluad Quad(high Dose 65+) 05/23/2020, 01/15/2021, 03/05/2022   Influenza-Unspecified 03/04/2017, 01/18/2020   PFIZER(Purple Top)SARS-COV-2 Vaccination 06/16/2019, 07/14/2019, 02/21/2020   PNEUMOCOCCAL CONJUGATE-20 01/28/2022   Pneumococcal Conjugate-13 07/04/2013   Pneumococcal Polysaccharide-23 12/30/2016   Tdap 06/29/2010    TDAP status: Due, Education has been provided regarding the importance of this vaccine. Advised may receive this vaccine at local pharmacy or Health Dept. Aware to provide a copy of the vaccination record if obtained from local pharmacy or Health Dept. Verbalized acceptance and understanding.  Flu Vaccine  status: Up to date  Pneumococcal vaccine status: Up to date  Covid-19 vaccine status: Completed vaccines  Qualifies for Shingles Vaccine? Yes   Zostavax completed No   Shingrix Completed?: No.    Education has been provided regarding the importance of this vaccine. Patient has been advised to call insurance company to determine out of pocket expense if they have not yet received this vaccine. Advised may also receive vaccine at local pharmacy or Health Dept. Verbalized acceptance and understanding.  Screening Tests Health Maintenance  Topic Date Due   Zoster Vaccines- Shingrix (1 of 2) Never done   DTaP/Tdap/Td (2 - Td or Tdap) 06/29/2020   COVID-19 Vaccine (4 - 2023-24 season) 01/17/2022   MAMMOGRAM  01/29/2022   Medicare Annual Wellness (AWV)  06/24/2023   Fecal DNA (Cologuard)  12/18/2023   Pneumonia Vaccine 7+ Years old  Completed   INFLUENZA VACCINE  Completed   DEXA SCAN  Completed   Hepatitis C Screening  Completed   HPV VACCINES  Aged Out   COLONOSCOPY (Pts 45-3yr Insurance coverage will need to be confirmed)  Discontinued    Health Maintenance  Health Maintenance Due  Topic Date Due   Zoster Vaccines- Shingrix (1 of 2) Never done   DTaP/Tdap/Td (2 - Td or Tdap) 06/29/2020   COVID-19 Vaccine (4 - 2023-24 season) 01/17/2022   MAMMOGRAM  01/29/2022    Colorectal cancer screening: No longer required.   Mammogram status: Completed 01/30/20. Repeat every year  Bone Density status: Completed 01/19/19. Results reflect: Bone density results: OSTEOPENIA. Repeat every 2 years.  Additional Screening:  Hepatitis C Screening: Completed 07/17/15  Vision Screening: Recommended annual ophthalmology exams for early detection of glaucoma and other disorders of the eye. Is the patient up to date with their annual eye exam?  Yes  Who is the provider or what is the name of the office in which the patient attends annual eye exams? [place] If pt is not established with a provider,  would they like to be referred to a provider to establish care? No .   Dental Screening: Recommended annual dental exams for proper oral hygiene  Community Resource Referral / Chronic Care Management: CRR required this visit?  No   CCM required this visit?  No      Plan:     I have personally reviewed and noted the following in the patient's chart:   Medical and social history Use of alcohol, tobacco or illicit drugs  Current medications and supplements including opioid prescriptions. Patient is not currently taking opioid prescriptions. Functional ability and status Nutritional status Physical activity Advanced directives List of other physicians Hospitalizations, surgeries, and ER visits in previous 12 months Vitals Screenings to include cognitive, depression, and falls Referrals and appointments  In addition, I have reviewed and discussed with patient certain preventive protocols, quality metrics, and  best practice recommendations. A written personalized care plan for preventive services as well as general preventive health recommendations were provided to patient.     Willette Brace, LPN   [id number]   Nurse Notes: none

## 2022-06-26 ENCOUNTER — Other Ambulatory Visit (HOSPITAL_BASED_OUTPATIENT_CLINIC_OR_DEPARTMENT_OTHER): Payer: Self-pay

## 2022-06-26 ENCOUNTER — Encounter: Payer: Self-pay | Admitting: Family Medicine

## 2022-06-26 ENCOUNTER — Other Ambulatory Visit: Payer: Self-pay

## 2022-06-26 MED ORDER — VALACYCLOVIR HCL 1 G PO TABS
ORAL_TABLET | ORAL | 1 refills | Status: DC
  Filled 2022-06-26: qty 90, 90d supply, fill #0

## 2022-06-30 NOTE — Progress Notes (Addendum)
Patient Medicare AWV questionnaire was completed by the patient on 06/18/22; I have confirmed that all information answered by patient is correct and no changes since this date.           Subjective:   Madison Blair is a 77 y.o. female who presents for Medicare Annual (Subsequent) preventive examination.  Review of Systems     Cardiac Risk Factors include: advanced age (>34mn, >>18women);dyslipidemia;hypertension     Objective:    Today's Vitals   06/23/22 1518  BP: 122/74  Pulse: 73  Temp: 97.6 F (36.4 C)  SpO2: 98%  Weight: 119 lb 9.6 oz (54.3 kg)   Body mass index is 20.53 kg/m.     06/23/2022    3:30 PM 07/01/2021    8:13 AM 06/10/2021    3:32 PM 06/07/2019    3:17 PM 01/12/2019    1:23 PM 05/04/2016    1:37 PM  Advanced Directives  Does Patient Have a Medical Advance Directive? Yes Yes Yes Yes No No  Type of AParamed[place]of [place] will [place]   Does patient want to make changes to medical advance directive? No - Patient declined No - Patient declined  No - Patient declined    Copy of [place] Chart? Yes - validated most recent copy scanned in chart (See row information) Yes - validated most recent copy scanned in chart (See row information) Yes - validated most recent copy scanned in chart (See row information) No - copy requested    Would patient like information on creating a medical advance directive?     Yes (ED - Information included in AVS)     Current Medications (verified) Outpatient Encounter Medications as of 06/23/2022  Medication Sig   amLODipine (NORVASC) 5 MG tablet TAKE 1 TABLET (5 MG TOTAL) BY MOUTH DAILY.   aspirin EC 81 MG tablet Take 81 mg by mouth daily. Swallow whole.   cyanocobalamin (VITAMIN B12) 1000 MCG/ML injection 1000 MCG INJECTION ONCE PER WEEK FOR FOUR WEEKS, FOLLOWED BY  1000 MCG INJECTION ONCE PER MONTH.   estradiol (ESTRACE) 1 MG tablet Take 1.5 mg by mouth daily.   Multiple Vitamins-Minerals (CENTRUM SILVER PO) Take by mouth.   pantoprazole (PROTONIX) 20 MG tablet Take 1 tablet (20 mg total) by mouth daily.   rosuvastatin (CRESTOR) 10 MG tablet Take 1 tablet (10 mg total) by mouth daily.   SYRINGE/NEEDLE, DISP, 1 ML (B-D SYRINGE/NEEDLE 1CC/25GX5/8) 25G X 5/8" 1 ML MISC Use to injecti vitamin b12.   [DISCONTINUED] valACYclovir (VALTREX) 1000 MG tablet TAKE 2 TABLETS BY MOUTH TWICE A DAY FOR 1 DAY AT FIRST SIGN OF COLD SORE   No facility-administered encounter medications on file as of 06/23/2022.    Allergies (verified) Patient has no known allergies.   History: Past Medical History:  Diagnosis Date   Hyperlipidemia    PUD (peptic ulcer disease)    x6   Past Surgical History:  Procedure Laterality Date   ABDOMINAL HYSTERECTOMY     TAAH, BSO- removed cervix. bladder tack that failed   APPENDECTOMY     Bladder tack     failed   cataract surgery     TONSILLECTOMY     Family History  Problem Relation Age of Onset   Heart disease Mother        MVR; MI 86  Breast cancer Mother  in 7s   Heart disease Father        Pacemaker   Social History   Socioeconomic History   Marital status: Divorced    Spouse name: Not on file   Number of children: 2    Years of education: Not on file   Highest education level: Not on file  Occupational History    Comment: Retired  Tobacco Use   Smoking status: Former    Packs/day: 1.00    Years: 30.00    Total pack years: 30.00    Types: Cigarettes    Quit date: 05/19/1990    Years since quitting: 32.1   Smokeless tobacco: Never  Substance and Sexual Activity   Alcohol use: Not Currently    Comment: Rarely   Drug use: No   Sexual activity: Not on file  Other Topics Concern   Not on file  Social History Narrative   Divorced. 2 children (daughter 64, son 77 in 2017). 2 grandchildren from  daughter 5858 month old [name], mckenzie 1 month in 2017).    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 [employer]: dancing, [place], traveling internationally- favorite place [place], reading   Social Determinants of [place] Strain: Low Risk  (06/23/2022)   Overall Financial Resource Strain (CARDIA)    Difficulty of Paying Living Expenses: Not hard at all  Food Insecurity: No Food Insecurity (06/23/2022)   Hunger Vital Sign    Worried About Running Out of Food in the Last Year: Never true    [place] in the Last Year: Never true  Transportation Needs: No Transportation Needs (06/23/2022)   PRAPARE - Hydrologist (Medical): No    Lack of Transportation (Non-Medical): No  Physical Activity: Sufficiently Active (06/23/2022)   Exercise Vital Sign    Days of Exercise per Week: 5 days    Minutes of Exercise per Session: 60 min  Stress: No Stress Concern Present (06/23/2022)   [place]    Feeling of Stress : Not at all  Social Connections: Moderately Integrated (06/23/2022)   Social Connection and Isolation Panel [NHANES]    Frequency of Communication with Friends and Family: More than three times a week    Frequency of Social Gatherings with Friends and Family: More than three times a week    Attends Religious Services: More than 4 times per year    Active Member of [employer] or Organizations: Yes    Attends Music therapist: More than 4 times per year    Marital Status: Divorced    Tobacco Counseling Counseling given: Not Answered   Clinical Intake:  Pre-visit preparation completed: Yes  Pain : No/denies pain     BMI - recorded: 20.53 Nutritional Status: BMI of 19-24  Normal Nutritional Risks: None Diabetes: No  How often do you need to have someone help you when you read instructions, pamphlets, or  other written materials from your doctor or pharmacy?: 1 - Never  Diabetic?no  Interpreter Needed?: No  Information entered by :: Charlott Rakes, LPN   Activities of Daily Living    06/23/2022    3:30 PM 06/18/2022    4:48 PM  In your present state of health, do you have any difficulty performing the following activities:  Hearing? 1 1  Comment slight loss  Vision? 0 0  Difficulty concentrating or making decisions? 0 0  Walking or climbing stairs? 0 0  Dressing or bathing? 0 0  Doing errands, shopping? 0 0  Preparing Food and eating ? N N  Using the Toilet? N N  In the past six months, have you accidently leaked urine? N Y  Do you have problems with loss of bowel control? N N  Managing your Medications? N N  Managing your Finances? N N  Housekeeping or managing your Housekeeping? N N    Patient Care Team: Marin Olp, MD as PCP - General (Family Medicine) Lorretta Harp, MD as Consulting Physician (Cardiology) Madelin Rear, [facility] (Inactive) as Pharmacist (Pharmacist)  Indicate any recent Medical Services you may have received from other than [facility] providers in the past year (date may be approximate).     Assessment:   This is a routine wellness examination for [place].  Hearing/Vision screen Hearing Screening - Comments:: Pt stated slight hearing loss  Vision Screening - Comments:: Pt follows up with Dr [place] for annual eye exams   Dietary issues and exercise activities discussed: Current Exercise Habits: Home exercise routine, Type of exercise: Other - see comments (shag dance), Time (Minutes): 60, Frequency (Times/Week): 5, Weekly Exercise (Minutes/Week): 300   Goals Addressed             This Visit's Progress    Patient Stated       Gain a little       Depression Screen    06/23/2022    3:28 PM 06/10/2021    3:31 PM 01/15/2021    9:32 AM 07/10/2020   12:50 PM 01/10/2020   10:37 AM 01/10/2020   10:36 AM 06/07/2019    3:18 PM  PHQ 2/9 Scores  PHQ - 2  Score 0 0 0 0 0 0 0  PHQ- 9 Score     2      Fall Risk    06/18/2022    4:48 PM 06/10/2021    3:33 PM 01/15/2021    9:32 AM 07/10/2020   12:50 PM 01/10/2020   10:39 AM  Fall Risk   Falls in the past year? 0 0 0 0 0  Number falls in past yr: 0 0 0 0 0  Injury with Fall? 0 0 0 0 0  Risk for fall due to : Impaired vision Impaired vision No Fall Risks    Follow up Falls prevention discussed Falls prevention discussed Falls evaluation completed      FALL RISK PREVENTION PERTAINING TO THE HOME:  Any stairs in or around the home? No  If so, are there any without handrails? No  Home free of loose throw rugs in walkways, pet beds, electrical cords, etc? Yes  Adequate lighting in your home to reduce risk of falls? Yes   ASSISTIVE DEVICES UTILIZED TO PREVENT FALLS:  Life alert? No  Use of a cane, walker or w/c? No  Grab bars in the bathroom? Yes  Shower chair or bench in shower? No  Elevated toilet seat or a handicapped toilet? No   TIMED UP AND GO:  Was the test performed? Yes .  Length of time to ambulate 10 feet: 10 sec.   Gait steady and fast without use of assistive device  Cognitive Function:        06/23/2022    3:32 PM 06/10/2021    3:35 PM  6CIT Screen  What Year? 0 points 0 points  What month? 0  points 0 points  What time? 0 points 0 points  Count back from 20 0 points 0 points  Months in reverse 0 points 0 points  Repeat phrase 0 points 2 points  Total Score 0 points 2 points    Immunizations Immunization History  Administered Date(s) Administered   Fluad Quad(high Dose 65+) 05/23/2020, 01/15/2021, 03/05/2022   Influenza-Unspecified 03/04/2017, 01/18/2020   PFIZER(Purple Top)SARS-COV-2 Vaccination 06/16/2019, 07/14/2019, 02/21/2020   PNEUMOCOCCAL CONJUGATE-20 01/28/2022   Pneumococcal Conjugate-13 07/04/2013   Pneumococcal Polysaccharide-23 12/30/2016   Tdap 06/29/2010    TDAP status: Due, Education has been provided regarding the importance of this  vaccine. Advised may receive this vaccine at local pharmacy or Health Dept. Aware to provide a copy of the vaccination record if obtained from local pharmacy or Health Dept. Verbalized acceptance and understanding.  Flu Vaccine status: Up to date  Pneumococcal vaccine status: Up to date  Covid-19 vaccine status: Completed vaccines  Qualifies for Shingles Vaccine? Yes   Zostavax completed No   Shingrix Completed?: No.    Education has been provided regarding the importance of this vaccine. Patient has been advised to call insurance company to determine out of pocket expense if they have not yet received this vaccine. Advised may also receive vaccine at local pharmacy or Health Dept. Verbalized acceptance and understanding.  Screening Tests Health Maintenance  Topic Date Due   Zoster Vaccines- Shingrix (1 of 2) Never done   DTaP/Tdap/Td (2 - Td or Tdap) 06/29/2020   COVID-19 Vaccine (4 - 2023-24 season) 01/17/2022   MAMMOGRAM  01/29/2022   Medicare Annual Wellness (AWV)  06/24/2023   Fecal DNA (Cologuard)  12/18/2023   Pneumonia Vaccine 40+ Years old  Completed   INFLUENZA VACCINE  Completed   DEXA SCAN  Completed   Hepatitis C Screening  Completed   HPV VACCINES  Aged Out   COLONOSCOPY (Pts 45-72yr Insurance coverage will need to be confirmed)  Discontinued    Health Maintenance  Health Maintenance Due  Topic Date Due   Zoster Vaccines- Shingrix (1 of 2) Never done   DTaP/Tdap/Td (2 - Td or Tdap) 06/29/2020   COVID-19 Vaccine (4 - 2023-24 season) 01/17/2022   MAMMOGRAM  01/29/2022    Colorectal cancer screening: No longer required.   Mammogram status: Completed 01/30/20. Repeat every year  Bone Density status: Completed 01/19/19. Results reflect: Bone density results: OSTEOPENIA. Repeat every 2 years.  Additional Screening:  Hepatitis C Screening: Completed 07/17/15  Vision Screening: Recommended annual ophthalmology exams for early detection of glaucoma and other  disorders of the eye. Is the patient up to date with their annual eye exam?  Yes  Who is the provider or what is the name of the office in which the patient attends annual eye exams? [place] If pt is not established with a provider, would they like to be referred to a provider to establish care? No .   Dental Screening: Recommended annual dental exams for proper oral hygiene  Community Resource Referral / Chronic Care Management: CRR required this visit?  No   CCM required this visit?  No      Plan:     I have personally reviewed and noted the following in the patient's chart:   Medical and social history Use of alcohol, tobacco or illicit drugs  Current medications and supplements including opioid prescriptions. Patient is not currently taking opioid prescriptions. Functional ability and status Nutritional status Physical activity Advanced directives List of other physicians Hospitalizations, surgeries, and  ER visits in previous 12 months Vitals Screenings to include cognitive, depression, and falls Referrals and appointments  In addition, I have reviewed and discussed with patient certain preventive protocols, quality metrics, and best practice recommendations. A written personalized care plan for preventive services as well as general preventive health recommendations were provided to patient.     Willette Brace, LPN   [id number]   Nurse Notes: none

## 2022-07-02 ENCOUNTER — Encounter: Payer: Self-pay | Admitting: Family Medicine

## 2022-07-03 ENCOUNTER — Encounter: Payer: Self-pay | Admitting: Family Medicine

## 2022-07-08 ENCOUNTER — Other Ambulatory Visit (HOSPITAL_BASED_OUTPATIENT_CLINIC_OR_DEPARTMENT_OTHER): Payer: Self-pay

## 2022-07-08 MED ORDER — AZITHROMYCIN 250 MG PO TABS
ORAL_TABLET | ORAL | 0 refills | Status: DC
  Filled 2022-07-08: qty 6, 5d supply, fill #0

## 2022-07-08 MED ORDER — HYDROCOD POLI-CHLORPHE POLI ER 10-8 MG/5ML PO SUER
5.0000 mL | Freq: Two times a day (BID) | ORAL | 0 refills | Status: DC
  Filled 2022-07-08: qty 60, 6d supply, fill #0

## 2022-07-25 ENCOUNTER — Other Ambulatory Visit (HOSPITAL_BASED_OUTPATIENT_CLINIC_OR_DEPARTMENT_OTHER): Payer: Self-pay

## 2022-07-29 ENCOUNTER — Encounter: Payer: Self-pay | Admitting: Family Medicine

## 2022-07-29 ENCOUNTER — Ambulatory Visit (INDEPENDENT_AMBULATORY_CARE_PROVIDER_SITE_OTHER): Payer: PPO | Admitting: Family Medicine

## 2022-07-29 ENCOUNTER — Other Ambulatory Visit (HOSPITAL_BASED_OUTPATIENT_CLINIC_OR_DEPARTMENT_OTHER): Payer: Self-pay

## 2022-07-29 VITALS — BP 126/78 | HR 71 | Temp 97.9°F | Ht 64.0 in | Wt 120.8 lb

## 2022-07-29 DIAGNOSIS — E538 Deficiency of other specified B group vitamins: Secondary | ICD-10-CM | POA: Insufficient documentation

## 2022-07-29 DIAGNOSIS — I10 Essential (primary) hypertension: Secondary | ICD-10-CM

## 2022-07-29 DIAGNOSIS — Z79899 Other long term (current) drug therapy: Secondary | ICD-10-CM | POA: Diagnosis not present

## 2022-07-29 DIAGNOSIS — E559 Vitamin D deficiency, unspecified: Secondary | ICD-10-CM

## 2022-07-29 DIAGNOSIS — E782 Mixed hyperlipidemia: Secondary | ICD-10-CM

## 2022-07-29 DIAGNOSIS — G4700 Insomnia, unspecified: Secondary | ICD-10-CM | POA: Diagnosis not present

## 2022-07-29 LAB — COMPREHENSIVE METABOLIC PANEL
ALT: 9 U/L (ref 0–35)
AST: 14 U/L (ref 0–37)
Albumin: 3.7 g/dL (ref 3.5–5.2)
Alkaline Phosphatase: 61 U/L (ref 39–117)
BUN: 11 mg/dL (ref 6–23)
CO2: 26 mEq/L (ref 19–32)
Calcium: 9.6 mg/dL (ref 8.4–10.5)
Chloride: 105 mEq/L (ref 96–112)
Creatinine, Ser: 0.76 mg/dL (ref 0.40–1.20)
GFR: 77.26 mL/min (ref >60.00)
Glucose, Bld: 93 mg/dL (ref 70–99)
Potassium: 4.4 mEq/L (ref 3.5–5.1)
Sodium: 140 mEq/L (ref 135–145)
Total Bilirubin: 0.6 mg/dL (ref 0.2–1.2)
Total Protein: 6.4 g/dL (ref 6.0–8.3)

## 2022-07-29 LAB — LDL CHOLESTEROL, DIRECT: Direct LDL: 61 mg/dL

## 2022-07-29 LAB — TSH: TSH: 2.22 u[IU]/mL (ref 0.35–5.50)

## 2022-07-29 MED ORDER — PANTOPRAZOLE SODIUM 40 MG PO TBEC
40.0000 mg | DELAYED_RELEASE_TABLET | Freq: Every day | ORAL | 3 refills | Status: DC
  Filled 2022-07-29: qty 90, 90d supply, fill #0
  Filled 2023-02-16: qty 90, 90d supply, fill #1
  Filled 2023-05-13: qty 90, 90d supply, fill #2

## 2022-07-29 NOTE — Patient Instructions (Addendum)
Get MAMMOGRAM scheduled.  Let us know when you get your COVID vaccine at the pharmacy.   improvement with higher dose of pantoprazole 40 mg- will send in- if doing better next visit can look at going back to 20 mg- if worsens again would recommend GI consult   suspect improved b12- she is going to trial oral b12 1000 mcg when finishes b12 and twe can recheck b12 next visit   wakes up 3 am most nights- encouraged no tv in the bed and no tv/phone/tablet/etc for at least 30 minutes before bed  Please stop by lab before you go If you have mychart- we will send your results within 3 business days of [place] receiving them.  If you do not have mychart- we will call you about results within 5 business days of [place] receiving them.  *please also note that you will see labs on mychart as soon as they post. I will later go in and write notes on them- will say "notes from Dr. Yong Channel"   Recommended follow up: Return in about 6 months (around 01/29/2023) for physical or sooner if needed.Schedule b4 you leave.

## 2022-07-29 NOTE — Progress Notes (Signed)
Phone [phone] In person visit   Subjective:   Madison Blair is a 77 y.o. year old very pleasant female patient who presents for/with See problem oriented charting Chief Complaint  Patient presents with   Follow-up    (No mask).   Hypertension    Past Medical History-  Patient Active Problem List   Diagnosis Date Noted   B12 deficiency 07/29/2022    Priority: Medium    Essential hypertension 06/07/2019    Priority: Medium    Vitamin D deficiency 12/30/2016    Priority: Medium    Osteopenia 12/30/2016    Priority: Medium    PVC's (premature ventricular contractions) 01/16/2016    Priority: Medium    Positive hepatitis C antibody test 06/29/2013    Priority: Medium    Hyperlipidemia 08/01/2011    Priority: Medium    Fever blister 03/08/2019    Priority: Low   History of Mohs micrographic surgery for skin cancer 03/24/2018    Priority: Low   Insomnia 06/11/2015    Priority: Low   Mitral valve prolapse 06/11/2015    Priority: Low   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 90 tablet 1   aspirin EC 81 MG tablet Take 81 mg by mouth daily. Swallow whole.     cyanocobalamin (VITAMIN B12) 1000 MCG/ML injection 1000 MCG INJECTION ONCE PER WEEK FOR FOUR WEEKS, FOLLOWED BY 1000 MCG INJECTION ONCE PER MONTH. 12 mL 1   estradiol (ESTRACE) 1 MG tablet Take 1.5 mg by mouth daily.     Multiple Vitamins-Minerals (CENTRUM SILVER PO) Take by mouth.     pantoprazole (PROTONIX) 40 MG tablet Take 1 tablet (40 mg total) by mouth daily. 90 tablet 3   rosuvastatin (CRESTOR) 10 MG tablet Take 1 tablet (10 mg total) by mouth daily. 90 tablet 3    SYRINGE/NEEDLE, DISP, 1 ML (B-D SYRINGE/NEEDLE 1CC/25GX5/8) 25G X 5/8" 1 ML MISC Use to injecti vitamin b12. 30 each 0   valACYclovir (VALTREX) 1000 MG tablet TAKE 2 TABLETS BY MOUTH TWICE A DAY FOR 1 DAY AT FIRST SIGN OF COLD SORE 90 tablet 1   No current facility-administered medications for this visit.     Objective:  BP 126/78   Pulse 71   Temp 97.9 F (36.6 C)   Ht '5\' 4"'$  (1.626 m)   Wt 120 lb 12.8 oz (54.8 kg)   SpO2 100%   BMI 20.74 kg/m  Gen: NAD, resting comfortably Tympanic membrane normal bilaterally  CV: RRR no murmurs rubs or gallops Lungs: CTAB no crackles, wheeze, rhonchi Ext: no edema Skin: warm, dry     Assessment and Plan   #recent influenza- about 2 weeks ago- has recovered well  #hypertension S: medication: Amlodipine 5 mg Home readings #s: 128-130/80 -still dancing 4 days a week BP Readings from Last 3 Encounters:  07/29/22 126/78  06/23/22 122/74  01/28/22 136/78  A/P: stable- continue current medicines   #hyperlipidemia S: Medication:Rosuvastatin 10 mg daily, she prefers to take aspirin 81 mg Lab Results  Component Value Date   CHOL 228 (H) 01/28/2022   HDL 63.60 01/28/2022   LDLCALC  133 (H) 01/28/2022   LDLDIRECT 135.0 01/03/2019   TRIG 159.0 (H) 01/28/2022   CHOLHDL 4 01/28/2022   A/P: hopefully improved- update cmp and direct LDL with labs   # GERD S:Medication: Pantoprazole 20 mg. Was having some issues recently and increased to 40 mg - noted improvement in symptoms in burning A/P: improvement with higher dose of pantoprazole 40 mg- will send in- if doing better next visit can look at going back to 20 mg- if worsens again would recommend GI consult   # B12 deficiency-potentially related to PPI S: Current treatment/medication (oral vs. 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 Future Appointments  Date Time Provider [place]  07/02/2023  2:30 PM [facility]   Lab/Order associations:   ICD-10-CM   1. Essential hypertension  I10 Comprehensive metabolic panel    2. Mixed hyperlipidemia  E78.2 LDL cholesterol, direct    Comprehensive metabolic panel    TSH    3. Vitamin D deficiency  E55.9     4. B12 deficiency  E53.8     5. Insomnia, unspecified type  G47.00       Meds ordered this encounter  Medications   pantoprazole (PROTONIX) 40 MG tablet    Sig: Take 1 tablet (40 mg total) by mouth daily.    Dispense:  90 tablet    Refill:  3    Return precautions advised.  Garret Reddish, MD

## 2022-07-30 NOTE — Addendum Note (Signed)
Addended by: Loura Back on: 07/30/2022 03:39 PM   Modules accepted: Orders

## 2022-07-30 NOTE — Addendum Note (Signed)
Addended by: Loura Back on: 07/30/2022 03:50 PM   Modules accepted: Orders

## 2022-09-15 ENCOUNTER — Other Ambulatory Visit: Payer: Self-pay | Admitting: Family Medicine

## 2022-09-24 ENCOUNTER — Other Ambulatory Visit (HOSPITAL_BASED_OUTPATIENT_CLINIC_OR_DEPARTMENT_OTHER): Payer: Self-pay

## 2022-09-24 MED ORDER — ESTRADIOL 1 MG PO TABS
1.5000 mg | ORAL_TABLET | Freq: Every day | ORAL | 2 refills | Status: DC
  Filled 2022-09-24: qty 135, 90d supply, fill #0
  Filled 2023-05-06: qty 135, 90d supply, fill #1

## 2022-09-24 MED FILL — Amlodipine Besylate Tab 5 MG (Base Equivalent): ORAL | 90 days supply | Qty: 90 | Fill #0 | Status: AC

## 2022-09-26 ENCOUNTER — Other Ambulatory Visit (HOSPITAL_BASED_OUTPATIENT_CLINIC_OR_DEPARTMENT_OTHER): Payer: Self-pay

## 2022-10-01 ENCOUNTER — Encounter: Payer: Self-pay | Admitting: Family Medicine

## 2022-10-01 ENCOUNTER — Other Ambulatory Visit (HOSPITAL_BASED_OUTPATIENT_CLINIC_OR_DEPARTMENT_OTHER): Payer: Self-pay

## 2022-10-13 ENCOUNTER — Other Ambulatory Visit (HOSPITAL_BASED_OUTPATIENT_CLINIC_OR_DEPARTMENT_OTHER): Payer: Self-pay

## 2022-10-13 MED ORDER — AMOXICILLIN-POT CLAVULANATE 875-125 MG PO TABS
1.0000 | ORAL_TABLET | Freq: Two times a day (BID) | ORAL | 0 refills | Status: DC
  Filled 2022-10-13: qty 10, 5d supply, fill #0

## 2022-10-14 ENCOUNTER — Other Ambulatory Visit (HOSPITAL_BASED_OUTPATIENT_CLINIC_OR_DEPARTMENT_OTHER): Payer: Self-pay

## 2022-10-15 ENCOUNTER — Encounter: Payer: Self-pay | Admitting: Family Medicine

## 2022-11-12 DIAGNOSIS — H43813 Vitreous degeneration, bilateral: Secondary | ICD-10-CM | POA: Diagnosis not present

## 2023-01-01 ENCOUNTER — Encounter (INDEPENDENT_AMBULATORY_CARE_PROVIDER_SITE_OTHER): Payer: Self-pay

## 2023-01-06 MED FILL — Amlodipine Besylate Tab 5 MG (Base Equivalent): ORAL | 90 days supply | Qty: 90 | Fill #1 | Status: AC

## 2023-02-03 ENCOUNTER — Ambulatory Visit (INDEPENDENT_AMBULATORY_CARE_PROVIDER_SITE_OTHER): Payer: PPO | Admitting: Family Medicine

## 2023-02-03 ENCOUNTER — Other Ambulatory Visit (HOSPITAL_BASED_OUTPATIENT_CLINIC_OR_DEPARTMENT_OTHER): Payer: Self-pay

## 2023-02-03 ENCOUNTER — Encounter: Payer: Self-pay | Admitting: Family Medicine

## 2023-02-03 VITALS — BP 120/62 | HR 70 | Temp 98.7°F | Ht 64.0 in | Wt 120.2 lb

## 2023-02-03 DIAGNOSIS — Z23 Encounter for immunization: Secondary | ICD-10-CM

## 2023-02-03 DIAGNOSIS — E782 Mixed hyperlipidemia: Secondary | ICD-10-CM

## 2023-02-03 DIAGNOSIS — I10 Essential (primary) hypertension: Secondary | ICD-10-CM

## 2023-02-03 DIAGNOSIS — E559 Vitamin D deficiency, unspecified: Secondary | ICD-10-CM

## 2023-02-03 DIAGNOSIS — Z0000 Encounter for general adult medical examination without abnormal findings: Secondary | ICD-10-CM | POA: Diagnosis not present

## 2023-02-03 DIAGNOSIS — E538 Deficiency of other specified B group vitamins: Secondary | ICD-10-CM

## 2023-02-03 LAB — COMPREHENSIVE METABOLIC PANEL
ALT: 11 U/L (ref 0–35)
AST: 18 U/L (ref 0–37)
Albumin: 3.8 g/dL (ref 3.5–5.2)
Alkaline Phosphatase: 59 U/L (ref 39–117)
BUN: 12 mg/dL (ref 6–23)
CO2: 26 meq/L (ref 19–32)
Calcium: 9.2 mg/dL (ref 8.4–10.5)
Chloride: 106 meq/L (ref 96–112)
Creatinine, Ser: 0.7 mg/dL (ref 0.40–1.20)
GFR: 84.96 mL/min (ref >60.00)
Glucose, Bld: 86 mg/dL (ref 70–99)
Potassium: 4.3 meq/L (ref 3.5–5.1)
Sodium: 139 meq/L (ref 135–145)
Total Bilirubin: 0.5 mg/dL (ref 0.2–1.2)
Total Protein: 6.7 g/dL (ref 6.0–8.3)

## 2023-02-03 LAB — CBC WITH DIFFERENTIAL/PLATELET
Basophils Absolute: 0 10*3/uL (ref 0.0–0.1)
Basophils Relative: 0.3 % (ref 0.0–3.0)
Eosinophils Absolute: 0 10*3/uL (ref 0.0–0.7)
Eosinophils Relative: 0.8 % (ref 0.0–5.0)
HCT: 38 % (ref 36.0–46.0)
Hemoglobin: 12.6 g/dL (ref 12.0–15.0)
Lymphocytes Relative: 37.7 % (ref 12.0–46.0)
Lymphs Abs: 1.5 10*3/uL (ref 0.7–4.0)
MCHC: 33.2 g/dL (ref 30.0–36.0)
MCV: 88.2 fl (ref 78.0–100.0)
Monocytes Absolute: 0.3 10*3/uL (ref 0.1–1.0)
Monocytes Relative: 6.8 % (ref 3.0–12.0)
Neutro Abs: 2.1 10*3/uL (ref 1.4–7.7)
Neutrophils Relative %: 54.4 % (ref 43.0–77.0)
Platelets: 231 10*3/uL (ref 150.0–400.0)
RBC: 4.31 Mil/uL (ref 3.87–5.11)
RDW: 13.7 % (ref 11.5–15.5)
WBC: 3.9 10*3/uL — ABNORMAL LOW (ref 4.0–10.5)

## 2023-02-03 LAB — LIPID PANEL
Cholesterol: 159 mg/dL (ref 0–200)
HDL: 73.1 mg/dL (ref >39.00)
LDL Cholesterol: 58 mg/dL (ref 0–99)
NonHDL: 86.18
Total CHOL/HDL Ratio: 2
Triglycerides: 139 mg/dL (ref 0.0–149.0)
VLDL: 27.8 mg/dL (ref 0.0–40.0)

## 2023-02-03 LAB — VITAMIN D 25 HYDROXY (VIT D DEFICIENCY, FRACTURES): VITD: 38.23 ng/mL (ref 30.00–100.00)

## 2023-02-03 LAB — VITAMIN B12: Vitamin B-12: 575 pg/mL (ref 211–911)

## 2023-02-03 MED ORDER — ROSUVASTATIN CALCIUM 10 MG PO TABS
10.0000 mg | ORAL_TABLET | Freq: Every day | ORAL | 3 refills | Status: DC
  Filled 2023-02-03: qty 90, 90d supply, fill #0
  Filled 2023-04-29: qty 90, 90d supply, fill #1
  Filled 2023-07-29: qty 90, 90d supply, fill #2
  Filled 2023-10-26: qty 90, 90d supply, fill #3

## 2023-02-03 NOTE — Progress Notes (Signed)
Phone [phone]   Subjective:  Patient presents today for their annual physical. Chief complaint-noted.   See problem oriented charting- ROS- full  review of systems was completed and negative Per full ROS sheet completed by patient with some left neck tightness from a few weeks ago gradually improving  The following were reviewed and entered/updated in epic: Past Medical History:  Diagnosis Date   Hyperlipidemia    PUD (peptic ulcer disease)    x6   Patient Active Problem List   Diagnosis Date Noted   B12 deficiency 07/29/2022    Priority: Medium    Essential hypertension 06/07/2019    Priority: Medium    Vitamin D deficiency 12/30/2016    Priority: Medium    Osteopenia 12/30/2016    Priority: Medium    PVC's (premature ventricular contractions) 01/16/2016    Priority: Medium    Positive hepatitis C antibody test 06/29/2013    Priority: Medium    Hyperlipidemia 08/01/2011    Priority: Medium    Fever blister 03/08/2019    Priority: Low   History of Mohs micrographic surgery for skin cancer 03/24/2018    Priority: Low   Insomnia 06/11/2015    Priority: Low   Mitral valve prolapse 06/11/2015    Priority: Low   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 bladder tack that failed   APPENDECTOMY     Bladder tack     failed   cataract surgery     TONSILLECTOMY      Family History  Problem Relation Age of Onset   Heart disease Mother        MVR; MI 41   Breast cancer Mother        in 45s   Heart disease Father        Pacemaker    Medications- reviewed and updated Current Outpatient Medications  Medication Sig Dispense Refill   amLODipine (NORVASC) 5 MG tablet TAKE 1 TABLET (5 MG TOTAL) BY  MOUTH DAILY. 90 tablet 1   aspirin EC 81 MG tablet Take 81 mg by mouth daily. Swallow whole.     cyanocobalamin (VITAMIN B12) 1000 MCG/ML injection 1000 MCG INJECTION ONCE PER WEEK FOR FOUR WEEKS, FOLLOWED BY 1000 MCG INJECTION ONCE PER MONTH. 12 mL 1   estradiol (ESTRACE) 1 MG tablet Take 1.5 mg by mouth daily.     estradiol (ESTRACE) 1 MG tablet Take 1.5 tablets (1.5 mg total) by mouth daily. 135 tablet 2   Multiple Vitamins-Minerals (CENTRUM SILVER PO) Take by mouth.     pantoprazole (PROTONIX) 40 MG tablet Take 1 tablet (40 mg total) by mouth daily. 90 tablet 3   SYRINGE/NEEDLE, DISP, 1 ML (B-D SYRINGE/NEEDLE 1CC/25GX5/8) 25G X 5/8" 1 ML MISC Use to injecti vitamin b12. 30 each 0   valACYclovir (VALTREX) 1000 MG tablet TAKE 2 TABLETS BY MOUTH TWICE A DAY FOR 1 DAY AT FIRST SIGN OF COLD SORE 90 tablet 1   rosuvastatin (CRESTOR) 10 MG tablet Take 1 tablet (10 mg total) by mouth daily. 90 tablet 3   No current facility-administered medications for this visit.    Allergies-reviewed and updated No Known Allergies  Social History   Social  History Narrative   Divorced. 2 children (daughter 36, son Madison in 2017). 2 grandchildren from daughter 6157 month old Harrold Donath, mckenzie 1 month in 2017).    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 Oropharynx normal Neck: no thyromegaly CV: RRR no murmurs rubs or gallops Lungs: CTAB no crackles, wheeze, rhonchi Abdomen: soft/nontender/nondistended/normal bowel sounds. No rebound or guarding.  Ext: no edema Skin: warm, dry Neuro: grossly normal, moves all extremities, PERRLA   Assessment and Plan    77 y.o. Blair presenting for annual physical.  Health Maintenance counseling: 1. Anticipatory guidance: Patient counseled regarding regular dental exams -q6 months, eye exams - Dr. [place] ,  avoiding smoking and second hand smoke, limiting alcohol to 1 beverage per day- maybe 1 every 2 weeks , no illicit drugs .   2. Risk factor reduction:  Advised patient of need for regular exercise and diet rich and fruits and vegetables to reduce risk of heart attack and stroke.  Exercise- still dancing 3-4 days a week.  Diet/weight management-steady weight- tries to eat reasonably healthy  .  Wt Readings from Last 3 Encounters:  02/03/23 120 lb 3.2 oz (54.5 kg)  07/29/22 120 lb 12.8 oz (54.8 kg)  06/23/22 119 lb 9.6 oz (54.3 kg)  3. Immunizations/screenings/ancillary studies-flu shot today.  Declines Shingrix and COVID  Immunization History  Administered Date(s) Administered   Fluad Quad(high Dose 65+) 05/23/2020, 01/15/2021, 03/05/2022   Fluad Trivalent(High Dose 65+) 02/03/2023   Influenza-Unspecified 03/04/2017, 01/18/2020   PFIZER(Purple Top)SARS-COV-2 Vaccination 06/16/2019, 07/14/2019, 02/21/2020   PNEUMOCOCCAL CONJUGATE-20 01/28/2022   Pneumococcal Conjugate-13 07/04/2013   Pneumococcal Polysaccharide-23 12/30/2016   Tdap 06/29/2010  4. Cervical cancer screening- hysterectomy for benign reasons-still does Paps/pelvic exams with GYN per her preference 5. Breast cancer screening-  breast exam with Dr. Aldona Bar and mammogram  - scheduled tomorrow- she will have them send [place] a copy 6. Colon cancer screening - follows with [facility] at least in the past-Cologuard 12/17/2020 with 3-year repeat planned  7. Skin cancer screening-sees [facility] dermatology.advised regular sunscreen use. Denies worrisome, changing, or new skin lesions.   8. Birth control/STD check- postmenopausal/hysterectomy. Long term partner since 2019- declines std screening- monogamous   9. Osteoporosis screening at 6- osteopenia per  GYN-on Centrum Silver with calcium and vitamin D-  she is going to ask for update tomorrow  10. Smoking associated screening -former smoker-quit in 1990s-no regular screening   Status of chronic or acute concerns   #hypertension S: medication: Amlodipine 5 mg. Occasional if blood pressure runs high- very occasional- can take extra half -still dancing 3 days a week BP Readings from Last 3 Encounters:  02/03/23 120/62  07/29/22 126/78  06/23/22 122/74  A/P: stable- continue current medicines   #hyperlipidemia S: Medication:Rosuvastatin 10 mg daily, she prefers to take aspirin 81 mg Lab Results  Component Value Date   CHOL 228 (H) 01/28/2022   HDL 63.60 01/28/2022   LDLCALC 133 (H) 01/28/2022   LDLDIRECT 61.0 07/29/2022   TRIG 159.0 (H) 01/28/2022   CHOLHDL 4  01/28/2022   A/P: last Low Density Lipoprotein (LDL cholesterol) looked great- update today- likely continue current medications    # GERD S:Medication: Pantoprazole 40 mg A/P: doing well with higher dose- continue current medications    # B12 deficiency-potentially related to PPI S: Current treatment/medication (oral vs. IM): 1000 mcg injections at home. On oral now Lab Results  Component Value Date   VITAMINB12 181 (L) 01/28/2022  A/P: hopefully improved- update B12 today- took prior injectoins   #Vitamin D deficiency S: Medication:  takes mv with MV  Last vitamin D Lab Results  Component Value Date   VD25OH 34.79 01/28/2022  A/P: hopefully stable- update vitamin D today. Continue current meds for now    # Hormone replacement therapy-on Estrace through GYN through Dr. 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 Future Appointments  Date Time Provider Department Center  07/02/2023  2:30 PM [facility] ANNUAL  WELLNESS VISIT 1 [facility]EC   Lab/Order associations:NOT fasting   ICD-10-CM   1. Preventative health care  Z00.00     2. Essential hypertension  I10     3. Mixed hyperlipidemia  E78.2 Comprehensive metabolic panel    CBC with Differential/Platelet    Lipid panel    4. Vitamin D deficiency  E55.9 VITAMIN D 25 Hydroxy (Vit-D Deficiency, Fractures)    5. B12 deficiency  E53.8 Vitamin B12    6. Need for influenza vaccination  Z23 Flu Vaccine Trivalent High Dose (Fluad)     Meds ordered this encounter  Medications   rosuvastatin (CRESTOR) 10 MG tablet    Sig: Take 1 tablet (10 mg total) by mouth daily.    Dispense:  90 tablet    Refill:  3    Return precautions advised.  Tana Conch, MD

## 2023-02-03 NOTE — Patient Instructions (Addendum)
Have them send [place] mammogram from tomorrow  Please stop by lab before you go If you have mychart- we will send your results within 3 business days of [place] receiving them.  If you do not have mychart- we will call you about results within 5 business days of [place] receiving them.  *please also note that you will see labs on mychart as soon as they post. I will later go in and write notes on them- will say "notes from Dr. Durene Cal"   If neck fails to continue to improve let us know and we can do physical therapy referral. Massage may also help (but not covered by insurance)  Recommended follow up: Return in about 6 months (around 08/03/2023) for followup or sooner if needed.Schedule b4 you leave.

## 2023-02-04 ENCOUNTER — Other Ambulatory Visit (HOSPITAL_BASED_OUTPATIENT_CLINIC_OR_DEPARTMENT_OTHER): Payer: Self-pay

## 2023-02-04 ENCOUNTER — Other Ambulatory Visit: Payer: Self-pay | Admitting: Obstetrics & Gynecology

## 2023-02-04 DIAGNOSIS — Z01419 Encounter for gynecological examination (general) (routine) without abnormal findings: Secondary | ICD-10-CM | POA: Diagnosis not present

## 2023-02-04 DIAGNOSIS — Z6820 Body mass index (BMI) 20.0-20.9, adult: Secondary | ICD-10-CM | POA: Diagnosis not present

## 2023-02-04 DIAGNOSIS — Z1231 Encounter for screening mammogram for malignant neoplasm of breast: Secondary | ICD-10-CM | POA: Diagnosis not present

## 2023-02-04 DIAGNOSIS — E2839 Other primary ovarian failure: Secondary | ICD-10-CM

## 2023-02-04 LAB — HM MAMMOGRAPHY

## 2023-02-06 ENCOUNTER — Other Ambulatory Visit: Payer: Self-pay | Admitting: Obstetrics & Gynecology

## 2023-02-06 DIAGNOSIS — R928 Other abnormal and inconclusive findings on diagnostic imaging of breast: Secondary | ICD-10-CM

## 2023-02-18 ENCOUNTER — Other Ambulatory Visit: Payer: Self-pay | Admitting: Obstetrics & Gynecology

## 2023-02-18 ENCOUNTER — Ambulatory Visit
Admission: RE | Admit: 2023-02-18 | Discharge: 2023-02-18 | Disposition: A | Discharge status: Home / Self Care | Payer: PPO | Source: Ambulatory Visit | Attending: Obstetrics & Gynecology | Admitting: Obstetrics & Gynecology

## 2023-02-18 DIAGNOSIS — N6310 Unspecified lump in the right breast, unspecified quadrant: Secondary | ICD-10-CM

## 2023-02-18 DIAGNOSIS — N6315 Unspecified lump in the right breast, overlapping quadrants: Secondary | ICD-10-CM | POA: Diagnosis not present

## 2023-02-18 DIAGNOSIS — R928 Other abnormal and inconclusive findings on diagnostic imaging of breast: Secondary | ICD-10-CM

## 2023-02-19 ENCOUNTER — Ambulatory Visit
Admission: RE | Admit: 2023-02-19 | Discharge: 2023-02-19 | Disposition: A | Discharge status: Home / Self Care | Payer: PPO | Source: Ambulatory Visit | Attending: Obstetrics & Gynecology | Admitting: Obstetrics & Gynecology

## 2023-02-19 DIAGNOSIS — R928 Other abnormal and inconclusive findings on diagnostic imaging of breast: Secondary | ICD-10-CM

## 2023-02-19 DIAGNOSIS — N6314 Unspecified lump in the right breast, lower inner quadrant: Secondary | ICD-10-CM | POA: Diagnosis not present

## 2023-02-19 DIAGNOSIS — D241 Benign neoplasm of right breast: Secondary | ICD-10-CM | POA: Diagnosis not present

## 2023-02-19 DIAGNOSIS — N6310 Unspecified lump in the right breast, unspecified quadrant: Secondary | ICD-10-CM

## 2023-02-19 HISTORY — PX: BREAST BIOPSY: SHX20

## 2023-02-20 ENCOUNTER — Other Ambulatory Visit (HOSPITAL_BASED_OUTPATIENT_CLINIC_OR_DEPARTMENT_OTHER): Payer: Self-pay

## 2023-02-20 ENCOUNTER — Encounter: Payer: Self-pay | Admitting: Family Medicine

## 2023-02-20 DIAGNOSIS — X58XXXA Exposure to other specified factors, initial encounter: Secondary | ICD-10-CM | POA: Diagnosis not present

## 2023-02-20 DIAGNOSIS — S99922A Unspecified injury of left foot, initial encounter: Secondary | ICD-10-CM | POA: Diagnosis not present

## 2023-02-20 DIAGNOSIS — S91202A Unspecified open wound of left great toe with damage to nail, initial encounter: Secondary | ICD-10-CM | POA: Diagnosis not present

## 2023-02-20 LAB — SURGICAL PATHOLOGY

## 2023-02-20 MED ORDER — TRIAMCINOLONE ACETONIDE 0.1 % EX OINT
1.0000 | TOPICAL_OINTMENT | Freq: Two times a day (BID) | CUTANEOUS | 0 refills | Status: DC
  Filled 2023-02-20: qty 15, 8d supply, fill #0

## 2023-02-20 MED ORDER — DOXYCYCLINE HYCLATE 100 MG PO TABS
100.0000 mg | ORAL_TABLET | Freq: Two times a day (BID) | ORAL | 0 refills | Status: AC
  Filled 2023-02-20: qty 10, 5d supply, fill #0

## 2023-02-20 MED ORDER — MUPIROCIN 2 % EX OINT
1.0000 | TOPICAL_OINTMENT | Freq: Three times a day (TID) | CUTANEOUS | 0 refills | Status: AC
  Filled 2023-02-20: qty 22, 8d supply, fill #0

## 2023-02-25 ENCOUNTER — Encounter: Payer: Self-pay | Admitting: Family Medicine

## 2023-03-27 ENCOUNTER — Encounter: Payer: Self-pay | Admitting: Family Medicine

## 2023-03-30 ENCOUNTER — Ambulatory Visit (INDEPENDENT_AMBULATORY_CARE_PROVIDER_SITE_OTHER): Payer: PPO | Admitting: Physician Assistant

## 2023-03-30 ENCOUNTER — Encounter: Payer: Self-pay | Admitting: Physician Assistant

## 2023-03-30 VITALS — BP 110/70 | HR 63 | Temp 97.8°F | Ht 64.0 in | Wt 117.2 lb

## 2023-03-30 DIAGNOSIS — M79672 Pain in left foot: Secondary | ICD-10-CM

## 2023-03-30 DIAGNOSIS — R252 Cramp and spasm: Secondary | ICD-10-CM

## 2023-03-30 NOTE — Progress Notes (Signed)
Madison Blair is a 77 y.o. female here for a new problem.  History of Present Illness:   Chief Complaint  Patient presents with   Cyst    Pt c/o knot arch of left foot, x 3 weeks, has been having cramps in her feet x several months, she is a Horticulturist, commercial.   HPI  Knot (Left Foot) and bilateral lower extremity cramping Complains of knot in the arch of her left foot for the past 3 weeks as well as cramping in her feet and calves for the past several months.  Reports that she dances [place] 4-5 nights/week for a couple of hours using both feet evenly.  Notes she wears dance shoes with 1 inch heel and limited arch support. Also when dancing she only drinks water, but doesn't add in electrolytes. States that she can feel the knot with flexion of foot. Endorses tenderness focal to mid-plantar area on palpation, and cramping occurs  1-2/week but usually resolves after a spoonful of mustard. Denies pain when walking.  Past Medical History:  Diagnosis Date   Hyperlipidemia    PUD (peptic ulcer disease)    x6    Social History   Tobacco Use   Smoking status: Former    Current packs/day: 0.00    Average packs/day: 1 pack/day for 30.0 years (30.0 ttl pk-yrs)    Types: Cigarettes    Start date: 05/19/1960    Quit date: 05/19/1990    Years since quitting: 32.8   Smokeless tobacco: Never  Substance Use Topics   Alcohol use: Not Currently    Comment: Rarely   Drug use: No   Past Surgical History:  Procedure Laterality Date   ABDOMINAL HYSTERECTOMY     TAAH, BSO- removed cervix. bladder tack that failed   APPENDECTOMY     Bladder tack     failed   BREAST BIOPSY Right 02/19/2023   [place] RT BREAST BX W LOC DEV 1ST LESION IMG BX SPEC US GUIDE 02/19/2023 [facility] M[facility]MO[facility]   cataract surgery     TONSILLECTOMY     Family History  Problem Relation Age of Onset   Heart disease Mother        MVR; MI 70   Breast cancer Mother        in 26s   Heart disease Father        Pacemaker   No  Known Allergies Current Medications:   Current Outpatient Medications:    amLODipine (NORVASC) 5 MG tablet, TAKE 1 TABLET (5 MG TOTAL) BY MOUTH DAILY., Disp: 90 tablet, Rfl: 1   aspirin EC 81 MG tablet, Take 81 mg by mouth daily. Swallow whole., Disp: , Rfl:    estradiol (ESTRACE) 1 MG tablet, Take 1.5 tablets (1.5 mg total) by mouth daily., Disp: 135 tablet, Rfl: 2   Multiple Vitamins-Minerals (CENTRUM SILVER PO), Take by mouth., Disp: , Rfl:    pantoprazole (PROTONIX) 40 MG tablet, Take 1 tablet (40 mg total) by mouth daily., Disp: 90 tablet, Rfl: 3   rosuvastatin (CRESTOR) 10 MG tablet, Take 1 tablet (10 mg total) by mouth daily., Disp: 90 tablet, Rfl: 3   triamcinolone ointment (KENALOG) 0.1 %, Apply 1 Application topically 2 (two) times daily., Disp: 15 g, Rfl: 0   valACYclovir (VALTREX) 1000 MG tablet, TAKE 2 TABLETS BY MOUTH TWICE A DAY FOR 1 DAY AT FIRST SIGN OF COLD SORE, Disp: 90 tablet, Rfl: 1  Review of Systems:   ROS See pertinent positives and negatives as  per the HPI.  Vitals:   Vitals:   03/30/23 1314  BP: 110/70  Pulse: 63  Temp: 97.8 F (36.6 C)  TempSrc: Temporal  SpO2: 97%  Weight: 117 lb 4 oz (53.2 kg)  Height: 5\' 4"  (1.626 m)     Body mass index is 20.13 kg/m.  Physical Exam:   Physical Exam Constitutional:      Appearance: Normal appearance. She is well-developed.  HENT:     Head: Normocephalic and atraumatic.  Eyes:     General: Lids are normal.     Extraocular Movements: Extraocular movements intact.     Conjunctiva/sclera: Conjunctivae normal.  Pulmonary:     Effort: Pulmonary effort is normal.  Musculoskeletal:        General: Normal range of motion.     Cervical back: Normal range of motion and neck supple.       Feet:     Comments: No tenderness to palpation or obvious swelling to baseline calves  Skin:    General: Skin is warm and dry.  Neurological:     Mental Status: She is alert and oriented to person, place, and time.   Psychiatric:        Attention and Perception: Attention and perception normal.        Mood and Affect: Mood normal.        Behavior: Behavior normal.        Thought Content: Thought content normal.        Judgment: Judgment normal.     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 Will refer to sports medicine for further evaluation Consider more supportive shoes or arches  Cramp in lower leg Discussed need for oral rehydration if exercising for >1 hour at a time Consider blood work with Primary Care Provider (PCP) if worsens or persists No red flags on exam  I,Emily Lagle,acting as a scribe for [employer], PA.,have documented all relevant documentation on the behalf of Jarold Motto, PA,as directed by  Jarold Motto, PA while in the presence of Jarold Motto, [place].  I, Jarold Motto, [place], have reviewed all documentation for this visit. The documentation on 03/30/23 for the exam, diagnosis, procedures, and orders are all accurate and complete.  Jarold Motto, PA-C

## 2023-03-30 NOTE — Patient Instructions (Signed)
It was great to see you!  A referral has been placed for you to see one of our fantastic providers at [facility]. Someone from their office will be in touch soon regarding scheduling your appointment.  Their location:  [facility] [place]  [address] on the 1st floor Phone number [phone] Fax [phone].   This location is across the street from the entrance to [facility] and in the same complex as the [facility]   Take care,  Davianna Deutschman PA-C  

## 2023-03-31 ENCOUNTER — Other Ambulatory Visit: Payer: Self-pay | Admitting: Family Medicine

## 2023-04-01 ENCOUNTER — Other Ambulatory Visit (HOSPITAL_BASED_OUTPATIENT_CLINIC_OR_DEPARTMENT_OTHER): Payer: Self-pay

## 2023-04-01 MED ORDER — AMLODIPINE BESYLATE 5 MG PO TABS
5.0000 mg | ORAL_TABLET | Freq: Every day | ORAL | 1 refills | Status: DC
  Filled 2023-04-01: qty 90, 90d supply, fill #0
  Filled 2023-06-24: qty 90, 90d supply, fill #1

## 2023-04-07 ENCOUNTER — Ambulatory Visit: Payer: PPO | Admitting: Family Medicine

## 2023-04-07 ENCOUNTER — Other Ambulatory Visit: Payer: Self-pay

## 2023-04-07 VITALS — BP 138/70 | HR 48 | Ht 64.0 in | Wt 117.0 lb

## 2023-04-07 DIAGNOSIS — M722 Plantar fascial fibromatosis: Secondary | ICD-10-CM

## 2023-04-07 DIAGNOSIS — M79672 Pain in left foot: Secondary | ICD-10-CM | POA: Diagnosis not present

## 2023-04-07 NOTE — Progress Notes (Unsigned)
   Wynema Birch, am serving as a Neurosurgeon for Dr. Clementeen Graham.  Madison Blair is a 77 y.o. female who presents to [facility] today for L foot pain ongoing for about a month. She dances [place] 4-5 nights/wk for a couple of hrs. Pt locates pain to Bottom of left foot. Patient states that she hurt her big toe in dance and she lost the toenail patient is not sure if this is a secondary thing or its own separate thing. Patient will get a really bad cramp in the foot that she can't even stand.  Patient participates in [place] dancing.  Aggravates: full extension, touching it,  Treatments tried: massage,   Pertinent review of systems: No fevers or chills  Relevant historical information: Hypertension   Exam:  BP 138/70   Pulse (!) 48   Ht 5\' 4"  (1.626 m)   Wt 117 lb (53.1 kg)   SpO2 94%   BMI 20.08 kg/m  General: Well Developed, well nourished, and in no acute distress.   MSK: Left foot normal. Palpable nodule plantar aspect of the left foot along the mid arch.  This is mildly tender to palpation.  Normal foot and ankle motion.    Lab and Radiology Results  Diagn[facility] MSK Ultrasound of: Left foot plantar nodule The nodule visualized on ultrasound and superficial to the plantar aponeurosis and solid in nature.  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  Plan for offloading cushion in her shoes.  Recommend also Voltaren gel.  If needed consider steroid injection or referral to podiatry.   PDMP not reviewed this encounter. Orders Placed This Encounter  Procedures   [place] LIMITED JOINT SPACE STRUCTURES LOW LEFT(NO LINKED CHARGES)    Standing Status:   Future    Number of Occurrences:   1    Standing Expiration Date:   04/06/2024    Order Specific Question:   Reason for  Exam (SYMPTOM  OR DIAGNOSIS REQUIRED)    Answer:   left foot pain    Order Specific Question:   Preferred imaging location?    Answer:    [facility]   No orders of the defined types were placed in this encounter.    Discussed warning signs or symptoms. Please see discharge instructions. Patient expresses understanding.   The above documentation has been reviewed and is accurate and complete Clementeen Graham, M.D.

## 2023-04-07 NOTE — Patient Instructions (Addendum)
Thank you for coming in today.   I think this is a plantar fibromatosis.   Take the pressure off the nodule . Use a big bandaid with a hole in it at the painful spot.   You could modify your shoes to create a hole there.   I can do an injection if needed.   A podiatrist could do more if needed as well.

## 2023-04-08 DIAGNOSIS — M722 Plantar fascial fibromatosis: Secondary | ICD-10-CM | POA: Insufficient documentation

## 2023-05-06 ENCOUNTER — Other Ambulatory Visit (HOSPITAL_BASED_OUTPATIENT_CLINIC_OR_DEPARTMENT_OTHER): Payer: Self-pay

## 2023-05-07 ENCOUNTER — Other Ambulatory Visit (HOSPITAL_BASED_OUTPATIENT_CLINIC_OR_DEPARTMENT_OTHER): Payer: Self-pay

## 2023-05-07 MED ORDER — AZITHROMYCIN 250 MG PO TABS
ORAL_TABLET | ORAL | 0 refills | Status: DC
  Filled 2023-05-07: qty 6, 5d supply, fill #0

## 2023-05-08 ENCOUNTER — Other Ambulatory Visit (HOSPITAL_BASED_OUTPATIENT_CLINIC_OR_DEPARTMENT_OTHER): Payer: Self-pay

## 2023-06-25 DIAGNOSIS — L570 Actinic keratosis: Secondary | ICD-10-CM | POA: Diagnosis not present

## 2023-06-25 DIAGNOSIS — D1801 Hemangioma of skin and subcutaneous tissue: Secondary | ICD-10-CM | POA: Diagnosis not present

## 2023-06-25 DIAGNOSIS — Z85828 Personal history of other malignant neoplasm of skin: Secondary | ICD-10-CM | POA: Diagnosis not present

## 2023-06-25 DIAGNOSIS — L821 Other seborrheic keratosis: Secondary | ICD-10-CM | POA: Diagnosis not present

## 2023-06-25 DIAGNOSIS — D171 Benign lipomatous neoplasm of skin and subcutaneous tissue of trunk: Secondary | ICD-10-CM | POA: Diagnosis not present

## 2023-06-29 ENCOUNTER — Other Ambulatory Visit (HOSPITAL_BASED_OUTPATIENT_CLINIC_OR_DEPARTMENT_OTHER): Payer: Self-pay

## 2023-07-02 ENCOUNTER — Ambulatory Visit: Payer: PPO

## 2023-07-02 VITALS — BP 120/64 | Temp 98.2°F | Ht 64.5 in | Wt 118.4 lb

## 2023-07-02 DIAGNOSIS — Z0000 Encounter for general adult medical examination without abnormal findings: Secondary | ICD-10-CM | POA: Diagnosis not present

## 2023-07-02 NOTE — Progress Notes (Signed)
Subjective:   Madison Blair is a 78 y.o. female who presents for Medicare Annual (Subsequent) preventive examination.  Visit Complete: In person  Cardiac Risk Factors include: advanced age (>8men, >62 women);dyslipidemia;hypertension     Objective:    Today's Vitals   07/02/23 1407  BP: 120/64  Temp: 98.2 F (36.8 C)  SpO2: 96%  Weight: 118 lb 6.4 oz (53.7 kg)  Height: 5' 4.5" (1.638 m)   Body mass index is 20.01 kg/m.     07/02/2023    2:12 PM 06/23/2022    3:30 PM 07/01/2021    8:13 AM 06/10/2021    3:32 PM 06/07/2019    3:17 PM 01/12/2019    1:23 PM 05/04/2016    1:37 PM  Advanced Directives  Does Patient Have a Medical Advance Directive? Yes Yes Yes Yes Yes No No  Type of Estate agent of [place];Living will Healthcare Power of Freedom;Living will Healthcare Power of [place];Living will Healthcare Power of Attorney Living will;Healthcare Power of Attorney    Does patient want to make changes to medical advance directive? No - Patient declined No - Patient declined No - Patient declined  No - Patient declined    Copy of Healthcare Power of Attorney in Chart? Yes - validated most recent copy scanned in chart (See row information) Yes - validated most recent copy scanned in chart (See row information) Yes - validated most recent copy scanned in chart (See row information) Yes - validated most recent copy scanned in chart (See row information) No - copy requested    Would patient like information on creating a medical advance directive?      Yes (ED - Information included in AVS)     Current Medications (verified) Outpatient Encounter Medications as of 07/02/2023  Medication Sig   amLODipine (NORVASC) 5 MG tablet TAKE 1 TABLET (5 MG TOTAL) BY MOUTH DAILY.   aspirin EC 81 MG tablet Take 81 mg by mouth daily. Swallow whole.   azithromycin (ZITHROMAX Z-PAK) 250 MG tablet Take 2 tablets by mouth on day one then one tablet every 24 hours until all gone.    estradiol (ESTRACE) 1 MG tablet Take 1.5 tablets (1.5 mg total) by mouth daily.   Multiple Vitamins-Minerals (CENTRUM SILVER PO) Take by mouth.   pantoprazole (PROTONIX) 40 MG tablet Take 1 tablet (40 mg total) by mouth daily.   rosuvastatin (CRESTOR) 10 MG tablet Take 1 tablet (10 mg total) by mouth daily.   triamcinolone ointment (KENALOG) 0.1 % Apply 1 Application topically 2 (two) times daily.   valACYclovir (VALTREX) 1000 MG tablet TAKE 2 TABLETS BY MOUTH TWICE A DAY FOR 1 DAY AT FIRST SIGN OF COLD SORE   No facility-administered encounter medications on file as of 07/02/2023.    Allergies (verified) Patient has no known allergies.   History: Past Medical History:  Diagnosis Date   Hyperlipidemia    PUD (peptic ulcer disease)    x6   Past Surgical History:  Procedure Laterality Date   ABDOMINAL HYSTERECTOMY     TAAH, BSO- removed cervix. bladder tack that failed   APPENDECTOMY     Bladder tack     failed   BREAST BIOPSY Right 02/19/2023   [place] RT BREAST BX W LOC DEV 1ST LESION IMG BX SPEC US GUIDE 02/19/2023 [facility]   cataract surgery     TONSILLECTOMY     Family History  Problem Relation Age of Onset   Heart disease Mother  MVR; MI 75   Breast cancer Mother        in 74s   Heart disease Father        Pacemaker   Social History   Socioeconomic History   Marital status: Divorced    Spouse name: Not on file   Number of children: 2    Years of education: Not on file   Highest education level: Not on file  Occupational History    Comment: Retired  Tobacco Use   Smoking status: Former    Current packs/day: 0.00    Average packs/day: 1 pack/day for 30.0 years (30.0 ttl pk-yrs)    Types: Cigarettes    Start date: 05/19/1960    Quit date: 05/19/1990    Years since quitting: 33.1   Smokeless tobacco: Never  Substance and Sexual Activity   Alcohol use: Not Currently    Comment: Rarely   Drug use: No   Sexual activity: Not on file  Other Topics  Concern   Not on file  Social History Narrative   Divorced. 2 children (daughter 18, son 35 in 2017). 2 grandchildren from daughter 4931 month old Harrold Donath, mckenzie 1 month in 2017).    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 [employer]: dancing, shagging, traveling internationally- favorite place [place], reading   Social Drivers of [employer]: Low Risk  (07/02/2023)   Overall Financial Resource Strain (CARDIA)    Difficulty of Paying Living Expenses: Not hard at all  Food Insecurity: No Food Insecurity (07/02/2023)   Hunger Vital Sign    Worried About Running Out of Food in the Last Year: Never true    Ran Out of Food in the Last Year: Never true  Transportation Needs: No Transportation Needs (07/02/2023)   PRAPARE - Administrator, Civil Service (Medical): No    Lack of Transportation (Non-Medical): No  Physical Activity: Sufficiently Active (07/02/2023)   Exercise Vital Sign    Days of Exercise per Week: 5 days    Minutes of Exercise per Session: 120 min  Stress: No Stress Concern Present (07/02/2023)   [employer] of Occupational Health - Occupational Stress Questionnaire    Feeling of Stress : Not at all  Social Connections: Moderately Integrated (07/02/2023)   Social Connection and Isolation Panel [NHANES]    Frequency of Communication with Friends and Family: More than three times a week    Frequency of Social Gatherings with Friends and Family: More than three times a week    Attends Religious Services: More than 4 times per year    Active Member of [employer] or Organizations: Yes    Attends Banker Meetings: 1 to 4 times per year    Marital Status: Divorced    Tobacco Counseling Counseling given: Not Answered   Clinical Intake:  Pre-visit preparation completed: Yes  Pain : No/denies pain     BMI - recorded: 20.01 Nutritional Status: BMI of 19-24  Normal Nutritional  Risks: None Diabetes: No  How often do you need to have someone help you when you read instructions, pamphlets, or other written materials from your doctor or pharmacy?: 1 - Never  Interpreter Needed?: No  Information entered by :: Lanier Ensign, LPN   Activities of Daily Living    07/02/2023    2:09 PM  In your present state of health, do you have  any difficulty performing the following activities:  Hearing? 1  Comment hearing aid  Vision? 0  Difficulty concentrating or making decisions? 0  Walking or climbing stairs? 0  Dressing or bathing? 0  Doing errands, shopping? 0  Preparing Food and eating ? N  Using the Toilet? N  In the past six months, have you accidently leaked urine? Y  Comment wears a pesary  Do you have problems with loss of bowel control? N  Managing your Medications? N  Managing your Finances? N  Housekeeping or managing your Housekeeping? N    Patient Care Team: Shelva Majestic, MD as PCP - General (Family Medicine) Runell Gess, MD as Consulting Physician (Cardiology) Dahlia Byes, [facility] (Inactive) as Pharmacist (Pharmacist)  Indicate any recent Medical Services you may have received from other than [facility] providers in the past year (date may be approximate).     Assessment:   This is a routine wellness examination for Madison Blair.  Hearing/Vision screen Hearing Screening - Comments:: Pt has hearing aids  Vision Screening - Comments:: Pt follows up with [facility] for annual eye exams    Goals Addressed             This Visit's Progress    Patient Stated       Maintain health and activity        Depression Screen    07/02/2023    2:11 PM 03/30/2023    1:18 PM 02/03/2023    8:50 AM 07/29/2022    8:46 AM 06/23/2022    3:28 PM 06/10/2021    3:31 PM 01/15/2021    9:32 AM  PHQ 2/9 Scores  PHQ - 2 Score 0 0 0 0 0 0 0  PHQ- 9 Score  0 0 1       Fall Risk    07/02/2023    2:13 PM 02/03/2023    8:50 AM 07/29/2022    8:44 AM 06/18/2022     4:48 PM 06/10/2021    3:33 PM  Fall Risk   Falls in the past year? 0 0 0 0 0  Number falls in past yr: 0 0 0 0 0  Injury with Fall? 0 0 0 0 0  Risk for fall due to : No Fall Risks No Fall Risks No Fall Risks Impaired vision Impaired vision  Follow up Falls prevention discussed;Falls evaluation completed Falls evaluation completed Falls evaluation completed Falls prevention discussed Falls prevention discussed    MEDICARE RISK AT HOME: Medicare Risk at Home Any stairs in or around the home?: No If so, are there any without handrails?: No Home free of loose throw rugs in walkways, pet beds, electrical cords, etc?: Yes Adequate lighting in your home to reduce risk of falls?: Yes Life alert?: No Use of a cane, walker or w/c?: No Grab bars in the bathroom?: Yes Shower chair or bench in shower?: No Elevated toilet seat or a handicapped toilet?: No  TIMED UP AND GO:  Was the test performed?  Yes  Length of time to ambulate 10 feet: 10 sec Gait steady and fast without use of assistive device    Cognitive Function:        07/02/2023    2:13 PM 06/23/2022    3:32 PM 06/10/2021    3:35 PM  6CIT Screen  What Year? 0 points 0 points 0 points  What month? 0 points 0 points 0 points  What time? 0 points 0 points 0 points  Count back from  20 0 points 0 points 0 points  Months in reverse 0 points 0 points 0 points  Repeat phrase 0 points 0 points 2 points  Total Score 0 points 0 points 2 points    Immunizations Immunization History  Administered Date(s) Administered   Fluad Quad(high Dose 65+) 05/23/2020, 01/15/2021, 03/05/2022   Fluad Trivalent(High Dose 65+) 02/03/2023   Influenza-Unspecified 03/04/2017, 01/18/2020   PFIZER(Purple Top)SARS-COV-2 Vaccination 06/16/2019, 07/14/2019, 02/21/2020   PNEUMOCOCCAL CONJUGATE-20 01/28/2022   Pneumococcal Conjugate-13 07/04/2013   Pneumococcal Polysaccharide-23 12/30/2016   Tdap 06/29/2010      Flu Vaccine status: Up to  date  Pneumococcal vaccine status: Up to date  Covid-19 vaccine status: Information provided on how to obtain vaccines.   Qualifies for Shingles Vaccine? Yes   Zostavax completed No   Shingrix Completed?: No.    Education has been provided regarding the importance of this vaccine. Patient has been advised to call insurance company to determine out of pocket expense if they have not yet received this vaccine. Advised may also receive vaccine at local pharmacy or Health Dept. Verbalized acceptance and understanding.  Screening Tests Health Maintenance  Topic Date Due   Zoster Vaccines- Shingrix (1 of 2) Never done   COVID-19 Vaccine (4 - 2024-25 season) 03/29/2024 (Originally 01/18/2023)   Fecal DNA (Cologuard)  12/18/2023   Medicare Annual Wellness (AWV)  07/01/2024   Pneumonia Vaccine 57+ Years old  Completed   INFLUENZA VACCINE  Completed   DEXA SCAN  Completed   Hepatitis C Screening  Completed   HPV VACCINES  Aged Out   DTaP/Tdap/Td  Discontinued   Colonoscopy  Discontinued    Health Maintenance  Health Maintenance Due  Topic Date Due   Zoster Vaccines- Shingrix (1 of 2) Never done    Colorectal cancer screening: Type of screening: Cologuard. Completed 12/17/20. Repeat every 3 years  Mammogram status: Completed 02/19/23. Repeat every year  Bone Density status: Completed 01/19/19. Results reflect: Bone density results: OSTEOPOROSIS. Repeat every scheduled for 08/19/23 years.  Additional Screening:  Hepatitis C Screening:  Completed 07/17/15  Vision Screening: Recommended annual ophthalmology exams for early detection of glaucoma and other disorders of the eye. Is the patient up to date with their annual eye exam?  Yes  Who is the provider or what is the name of the office in which the patient attends annual eye exams? [facility]  If pt is not established with a provider, would they like to be referred to a provider to establish care? No .   Dental Screening: Recommended annual  dental exams for proper oral hygiene   Community Resource Referral / Chronic Care Management: CRR required this visit?  No   CCM required this visit?  No     Plan:     I have personally reviewed and noted the following in the patient's chart:   Medical and social history Use of alcohol, tobacco or illicit drugs  Current medications and supplements including opioid prescriptions. Patient is not currently taking opioid prescriptions. Functional ability and status Nutritional status Physical activity Advanced directives List of other physicians Hospitalizations, surgeries, and ER visits in previous 12 months Vitals Screenings to include cognitive, depression, and falls Referrals and appointments  In addition, I have reviewed and discussed with patient certain preventive protocols, quality metrics, and best practice recommendations. A written personalized care plan for preventive services as well as general preventive health recommendations were provided to patient.     Marzella Schlein, LPN   7/32/2025  After Visit Summary: (In Person-Printed) AVS printed and given to the patient  Nurse Notes: none

## 2023-07-02 NOTE — Patient Instructions (Signed)
Ms. Muraoka , Thank you for taking time to come for your Medicare Wellness Visit. I appreciate your ongoing commitment to your health goals. Please review the following plan we discussed and let me know if I can assist you in the future.   Referrals/Orders/Follow-Ups/Clinician Recommendations: maintain health and activity   This is a list of the screening recommended for you and due dates:  Health Maintenance  Topic Date Due   Zoster (Shingles) Vaccine (1 of 2) Never done   Medicare Annual Wellness Visit  06/24/2023   COVID-19 Vaccine (4 - 2024-25 season) 03/29/2024*   Cologuard (Stool DNA test)  12/18/2023   Pneumonia Vaccine  Completed   Flu Shot  Completed   DEXA scan (bone density measurement)  Completed   Hepatitis C Screening  Completed   HPV Vaccine  Aged Out   DTaP/Tdap/Td vaccine  Discontinued   Colon Cancer Screening  Discontinued  *Topic was postponed. The date shown is not the original due date.    Advanced directives: (In Chart) A copy of your advanced directives are scanned into your chart should your provider ever need it.  Next Medicare Annual Wellness Visit scheduled for next year: Yes

## 2023-08-04 ENCOUNTER — Other Ambulatory Visit: Payer: Self-pay

## 2023-08-04 ENCOUNTER — Encounter: Payer: Self-pay | Admitting: Family Medicine

## 2023-08-04 ENCOUNTER — Ambulatory Visit (INDEPENDENT_AMBULATORY_CARE_PROVIDER_SITE_OTHER): Payer: PPO | Admitting: Family Medicine

## 2023-08-04 VITALS — BP 138/70 | HR 69 | Temp 98.1°F | Ht 64.5 in | Wt 118.0 lb

## 2023-08-04 DIAGNOSIS — E782 Mixed hyperlipidemia: Secondary | ICD-10-CM

## 2023-08-04 DIAGNOSIS — I10 Essential (primary) hypertension: Secondary | ICD-10-CM | POA: Diagnosis not present

## 2023-08-04 LAB — CBC WITH DIFFERENTIAL/PLATELET
Basophils Absolute: 0 10*3/uL (ref 0.0–0.1)
Basophils Relative: 0.3 % (ref 0.0–3.0)
Eosinophils Absolute: 0 10*3/uL (ref 0.0–0.7)
Eosinophils Relative: 1.1 % (ref 0.0–5.0)
HCT: 40.6 % (ref 36.0–46.0)
Hemoglobin: 13.3 g/dL (ref 12.0–15.0)
Lymphocytes Relative: 42 % (ref 12.0–46.0)
Lymphs Abs: 1.4 10*3/uL (ref 0.7–4.0)
MCHC: 32.8 g/dL (ref 30.0–36.0)
MCV: 87.8 fl (ref 78.0–100.0)
Monocytes Absolute: 0.3 10*3/uL (ref 0.1–1.0)
Monocytes Relative: 8.7 % (ref 3.0–12.0)
Neutro Abs: 1.6 10*3/uL (ref 1.4–7.7)
Neutrophils Relative %: 47.9 % (ref 43.0–77.0)
Platelets: 228 10*3/uL (ref 150.0–400.0)
RBC: 4.62 Mil/uL (ref 3.87–5.11)
RDW: 13.5 % (ref 11.5–15.5)
WBC: 3.2 10*3/uL — ABNORMAL LOW (ref 4.0–10.5)

## 2023-08-04 LAB — COMPREHENSIVE METABOLIC PANEL
ALT: 14 U/L (ref 0–35)
AST: 17 U/L (ref 0–37)
Albumin: 4.2 g/dL (ref 3.5–5.2)
Alkaline Phosphatase: 65 U/L (ref 39–117)
BUN: 16 mg/dL (ref 6–23)
CO2: 25 meq/L (ref 19–32)
Calcium: 9.2 mg/dL (ref 8.4–10.5)
Chloride: 105 meq/L (ref 96–112)
Creatinine, Ser: 0.79 mg/dL (ref 0.40–1.20)
GFR: 73.22 mL/min (ref >60.00)
Glucose, Bld: 88 mg/dL (ref 70–99)
Potassium: 4.2 meq/L (ref 3.5–5.1)
Sodium: 139 meq/L (ref 135–145)
Total Bilirubin: 0.5 mg/dL (ref 0.2–1.2)
Total Protein: 7.2 g/dL (ref 6.0–8.3)

## 2023-08-04 NOTE — Progress Notes (Signed)
 Phone [phone] In person visit   Subjective:   Madison Blair is a 78 y.o. year old very pleasant female patient who presents for/with See problem oriented charting Chief Complaint  Patient presents with   Medical Management of Chronic Issues   Hypertension    Past Medical History-  Patient Active Problem List   Diagnosis Date Noted   B12 deficiency 07/29/2022    Priority: Medium    Essential hypertension 06/07/2019    Priority: Medium    Vitamin D deficiency 12/30/2016    Priority: Medium    Osteopenia 12/30/2016    Priority: Medium    PVC's (premature ventricular contractions) 01/16/2016    Priority: Medium    Positive hepatitis C antibody test 06/29/2013    Priority: Medium    Hyperlipidemia 08/01/2011    Priority: Medium    Fever blister 03/08/2019    Priority: Low   History of Mohs micrographic surgery for skin cancer 03/24/2018    Priority: Low   Insomnia 06/11/2015    Priority: Low   Mitral valve prolapse 06/11/2015    Priority: Low   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 90 tablet 1   aspirin EC 81 MG tablet Take 81 mg by mouth daily. Swallow whole.     estradiol (ESTRACE) 1 MG tablet Take 1.5 tablets (1.5 mg total) by mouth daily. 135 tablet 2   Multiple Vitamins-Minerals (CENTRUM SILVER PO) Take by mouth.     pantoprazole (PROTONIX) 40 MG tablet Take 1 tablet (40 mg total) by mouth daily. 90 tablet 3   rosuvastatin (CRESTOR) 10 MG tablet Take 1 tablet (10 mg total) by mouth daily. 90 tablet 3   valACYclovir (VALTREX) 1000 MG tablet TAKE 2 TABLETS BY MOUTH  TWICE A DAY FOR 1 DAY AT FIRST SIGN OF COLD SORE 90 tablet 1   No current facility-administered medications for this visit.     Objective:  BP 138/70   Pulse 69   Temp 98.1 F (36.7 C)   Ht 5' 4.5" (1.638 m)   Wt 118 lb (53.5 kg)   SpO2 96%   BMI 19.94 kg/m  Gen: NAD, resting comfortably CV: RRR no murmurs rubs or gallops Lungs: CTAB no crackles, wheeze, rhonchi Ext: no edema Skin: warm, dry     Assessment and Plan    #Low back pain- about 4 days ago went dancing- back really hurt her and radiated into legs- eased off and did heating pad as well as 3 ibuprofen and felt better into Saturday and then yesterday flared back up and sat on heating pad - feeling better today after another round of ibuprofen yesterday.  -reflecting back had lifted some heavy river rock out of trunk- and wonders if tweaked something -if persistent issues can see Dr. Denyse Amass back- seen for plantar fibromatosis   #hypertension S: medication: Amlodipine 5 mg Home readings #s: similar or better at home -still dancing 3 days a week BP Readings from Last 3 Encounters:  08/04/23 138/70  07/02/23 120/64  04/07/23 138/70  A/P: well controlled (high acceptable) continue  current medications   #hyperlipidemia S: Medication:Rosuvastatin 10 mg daily, she prefers to take aspirin 81 mg  Lab Results  Component Value Date   CHOL 159 02/03/2023   HDL 73.10 02/03/2023   LDLCALC 58 02/03/2023   LDLDIRECT 61.0 07/29/2022   TRIG 139.0 02/03/2023   CHOLHDL 2 02/03/2023   A/P: very well controlled last visit- update today   # GERD S:Medication: Pantoprazole 20 mg--> 40 mg A/P: we want to try to reduce dose as well controlled - she is going to do 40 mg every other day and see if that works- if it does might be able to send in just 20 mg daily later- she can reach out to me    # B12 deficiency-potentially related to proton pump inhibitor (PPI) stomach acid reducer- B12 looked good last visit- recheck next  visit  #Vitamin D deficiency S: Medication:  takes mv with MV  A/P: vitmain D looked good last visit- recheck next visit   # Hormone replacement therapy-on Estrace through GYN through Dr. Aldona Bar- attempted wean did not go well- but wants to try again- had hot flashes last time  # Plantar fibromatosis-has seen Dr. Denyse Amass 04/07/2023-offloading cushion in shoes plus Voltaren gel recommended.  Note of considering steroid injection or referral to podiatry but thankfully doing better   #Health maintenance- scheduled next month for DEXA   Recommended follow up: Return in about 6 months (around 02/04/2024) for physical or sooner if needed.Schedule b4 you leave. Future Appointments  Date Time Provider Department Center  08/19/2023  9:00 AM [facility] DX DEXA 1 [facility]  07/12/2024 11:20 AM [facility] ANNUAL WELLNESS VISIT 1 [facility]    Lab/Order associations:   ICD-10-CM   1. Essential hypertension  I10 Comprehensive metabolic panel    CBC with Differential/Platelet    2. Mixed hyperlipidemia  E78.2 Comprehensive metabolic panel    CBC with Differential/Platelet      No orders of the defined types were placed in this encounter.   Return precautions advised.  Tana Conch, MD

## 2023-08-04 NOTE — Patient Instructions (Addendum)
 Health Maintenance Due  Topic Date Due   Zoster Vaccines- Shingrix (1 of 2) Never done  Please check with your pharmacy to see if they have the shingrix vaccine. If they do- please get this immunization and update [place] by phone call or mychart with dates you receive the vaccine  we want to try to reduce dose as well controlled - she is going to do 40 mg every other day and see if that works- if it does might be able to send in just 20 mg daily later- she can reach out to me   Please stop by lab before you go If you have mychart- we will send your results within 3 business days of [place] receiving them.  If you do not have mychart- we will call you about results within 5 business days of [place] receiving them.  *please also note that you will see labs on mychart as soon as they post. I will later go in and write notes on them- will say "notes from Dr. Durene Cal"   Recommended follow up: Return in about 6 months (around 02/04/2024) for physical or sooner if needed.Schedule b4 you leave.

## 2023-08-19 ENCOUNTER — Ambulatory Visit
Admission: RE | Admit: 2023-08-19 | Discharge: 2023-08-19 | Disposition: A | Discharge status: Home / Self Care | Payer: PPO | Source: Ambulatory Visit | Attending: Obstetrics & Gynecology | Admitting: Obstetrics & Gynecology

## 2023-08-19 DIAGNOSIS — Z90722 Acquired absence of ovaries, bilateral: Secondary | ICD-10-CM | POA: Diagnosis not present

## 2023-08-19 DIAGNOSIS — M8588 Other specified disorders of bone density and structure, other site: Secondary | ICD-10-CM | POA: Diagnosis not present

## 2023-08-19 DIAGNOSIS — N958 Other specified menopausal and perimenopausal disorders: Secondary | ICD-10-CM | POA: Diagnosis not present

## 2023-08-19 DIAGNOSIS — E2839 Other primary ovarian failure: Secondary | ICD-10-CM | POA: Diagnosis not present

## 2023-08-24 ENCOUNTER — Encounter: Payer: Self-pay | Admitting: Family Medicine

## 2023-08-25 ENCOUNTER — Encounter: Payer: Self-pay | Admitting: Family Medicine

## 2023-08-25 ENCOUNTER — Ambulatory Visit (INDEPENDENT_AMBULATORY_CARE_PROVIDER_SITE_OTHER): Admitting: Family Medicine

## 2023-08-25 VITALS — BP 120/66 | HR 72 | Temp 98.0°F | Ht 64.5 in | Wt 114.2 lb

## 2023-08-25 DIAGNOSIS — H6122 Impacted cerumen, left ear: Secondary | ICD-10-CM

## 2023-08-25 DIAGNOSIS — I10 Essential (primary) hypertension: Secondary | ICD-10-CM

## 2023-08-25 NOTE — Patient Instructions (Signed)
 Cleared cerumen out of ear and pain thankfully resolved! Ear looks great- follow up if worsens again or other new symptoms  Recommended follow up: No follow-ups on file.

## 2023-08-25 NOTE — Progress Notes (Signed)
 Phone [phone] In person visit   Subjective:   Madison Blair is a 78 y.o. year old very pleasant female patient who presents for/with See problem oriented charting Chief Complaint  Patient presents with   left ear fullness    Pt c/o left ear fullness and pressure with pain.   Past Medical History-  Patient Active Problem List   Diagnosis Date Noted   B12 deficiency 07/29/2022    Priority: Medium    Essential hypertension 06/07/2019    Priority: Medium    Vitamin D deficiency 12/30/2016    Priority: Medium    Osteopenia 12/30/2016    Priority: Medium    PVC's (premature ventricular contractions) 01/16/2016    Priority: Medium    Positive hepatitis C antibody test 06/29/2013    Priority: Medium    Hyperlipidemia 08/01/2011    Priority: Medium    Fever blister 03/08/2019    Priority: Low   History of Mohs micrographic surgery for skin cancer 03/24/2018    Priority: Low   Insomnia 06/11/2015    Priority: Low   Mitral valve prolapse 06/11/2015    Priority: Low   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 90 tablet 1   aspirin EC 81 MG tablet Take 81 mg by mouth daily. Swallow whole.     estradiol (ESTRACE) 1 MG tablet Take 1.5 tablets (1.5 mg total) by mouth daily. 135 tablet 2   Multiple Vitamins-Minerals (CENTRUM SILVER PO) Take by mouth.     pantoprazole (PROTONIX) 40 MG tablet Take 1 tablet (40 mg total) by mouth daily. 90 tablet 3   rosuvastatin (CRESTOR) 10 MG tablet Take 1 tablet (10 mg total) by mouth daily. 90 tablet 3   valACYclovir (VALTREX) 1000 MG tablet TAKE 2  TABLETS BY MOUTH TWICE A DAY FOR 1 DAY AT FIRST SIGN OF COLD SORE 90 tablet 1   No current facility-administered medications for this visit.     Objective:  BP 120/66   Pulse 72   Temp 98 F (36.7 C)   Ht 5' 4.5" (1.638 m)   Wt 114 lb 3.2 oz (51.8 kg)   SpO2 94%   BMI 19.30 kg/m  Gen: NAD, resting comfortably Left-sided cerumen impaction-this was irrigated by my team and afterwards normal canal and healthy tympanic membrane on the left Right tympanic membrane normal     Assessment and Plan   # Left ear fullness and pain S: Patient reports left ear fullness and pressure associated with pain-this started on Sunday. Felt like it needed to pop at first- thought could be wax- tried peroxide . Popped with yawning and mild discomfort. Mild hearing loss. Felt like a tunnel when she would talk. No fever or chills. No congestion or allergies.   A/P: Patient with left ear cerumen impaction-after removal reported full restoration of hearing, no longer noting fullness, and resolution of pain-normal tympanic membrane.  #hypertension S: medication: Amlodipine 5 mg BP Readings from Last 3 Encounters:  08/25/23 120/66  08/04/23 138/70  07/02/23 120/64  A/P: well controlled continue current medications  Recommended follow up: Return for as needed for new, worsening, persistent symptoms. Future Appointments  Date Time Provider Department Center  02/16/2024  8:20 AM Shelva Majestic, MD [facility]  07/12/2024 11:20 AM [facility] ANNUAL WELLNESS VISIT 1 [facility]    Lab/Order associations:   ICD-10-CM   1. Impacted cerumen, left ear  H61.22     2. Essential hypertension  I10      Return precautions advised.  Tana Conch, MD

## 2023-09-15 ENCOUNTER — Other Ambulatory Visit: Payer: Self-pay | Admitting: Family Medicine

## 2023-09-15 ENCOUNTER — Other Ambulatory Visit (HOSPITAL_BASED_OUTPATIENT_CLINIC_OR_DEPARTMENT_OTHER): Payer: Self-pay

## 2023-09-15 MED ORDER — PANTOPRAZOLE SODIUM 40 MG PO TBEC
40.0000 mg | DELAYED_RELEASE_TABLET | Freq: Every day | ORAL | 3 refills | Status: AC
  Filled 2023-09-15: qty 90, 90d supply, fill #0
  Filled 2023-12-14: qty 90, 90d supply, fill #1
  Filled 2024-03-18: qty 90, 90d supply, fill #2
  Filled 2024-06-17: qty 90, 90d supply, fill #3

## 2023-09-29 ENCOUNTER — Other Ambulatory Visit (HOSPITAL_BASED_OUTPATIENT_CLINIC_OR_DEPARTMENT_OTHER): Payer: Self-pay

## 2023-09-29 ENCOUNTER — Other Ambulatory Visit: Payer: Self-pay | Admitting: Family Medicine

## 2023-09-29 MED ORDER — AMLODIPINE BESYLATE 5 MG PO TABS
5.0000 mg | ORAL_TABLET | Freq: Every day | ORAL | 1 refills | Status: DC
  Filled 2023-09-29: qty 90, 90d supply, fill #0
  Filled 2023-12-14: qty 90, 90d supply, fill #1

## 2023-10-13 ENCOUNTER — Other Ambulatory Visit (HOSPITAL_BASED_OUTPATIENT_CLINIC_OR_DEPARTMENT_OTHER): Payer: Self-pay

## 2023-10-13 MED ORDER — ESTRADIOL 1 MG PO TABS
1.5000 mg | ORAL_TABLET | Freq: Every day | ORAL | 2 refills | Status: AC
  Filled 2023-10-13: qty 135, 90d supply, fill #0
  Filled 2024-02-26: qty 135, 90d supply, fill #1

## 2023-10-19 ENCOUNTER — Other Ambulatory Visit (HOSPITAL_BASED_OUTPATIENT_CLINIC_OR_DEPARTMENT_OTHER): Payer: Self-pay

## 2023-10-19 MED ORDER — NYSTATIN 100000 UNIT/ML MT SUSP
5.0000 mL | OROMUCOSAL | 0 refills | Status: DC
  Filled 2023-10-19: qty 60, 3d supply, fill #0

## 2023-10-31 ENCOUNTER — Other Ambulatory Visit (HOSPITAL_BASED_OUTPATIENT_CLINIC_OR_DEPARTMENT_OTHER): Payer: Self-pay

## 2023-12-21 DIAGNOSIS — W19XXXA Unspecified fall, initial encounter: Secondary | ICD-10-CM | POA: Diagnosis not present

## 2023-12-21 DIAGNOSIS — M25551 Pain in right hip: Secondary | ICD-10-CM | POA: Diagnosis not present

## 2023-12-31 ENCOUNTER — Other Ambulatory Visit (HOSPITAL_BASED_OUTPATIENT_CLINIC_OR_DEPARTMENT_OTHER): Payer: Self-pay

## 2023-12-31 MED ORDER — AZITHROMYCIN 250 MG PO TABS
ORAL_TABLET | ORAL | 0 refills | Status: DC
  Filled 2023-12-31: qty 6, 5d supply, fill #0

## 2024-01-06 DIAGNOSIS — H26493 Other secondary cataract, bilateral: Secondary | ICD-10-CM | POA: Diagnosis not present

## 2024-01-12 ENCOUNTER — Other Ambulatory Visit (HOSPITAL_BASED_OUTPATIENT_CLINIC_OR_DEPARTMENT_OTHER): Payer: Self-pay

## 2024-01-12 ENCOUNTER — Other Ambulatory Visit: Payer: Self-pay | Admitting: Family Medicine

## 2024-01-12 MED ORDER — ROSUVASTATIN CALCIUM 10 MG PO TABS
10.0000 mg | ORAL_TABLET | Freq: Every day | ORAL | 3 refills | Status: AC
  Filled 2024-01-12 – 2024-01-22 (×2): qty 90, 90d supply, fill #0
  Filled 2024-05-10: qty 90, 90d supply, fill #1

## 2024-01-22 ENCOUNTER — Other Ambulatory Visit (HOSPITAL_BASED_OUTPATIENT_CLINIC_OR_DEPARTMENT_OTHER): Payer: Self-pay

## 2024-01-25 ENCOUNTER — Other Ambulatory Visit (HOSPITAL_BASED_OUTPATIENT_CLINIC_OR_DEPARTMENT_OTHER): Payer: Self-pay

## 2024-01-25 DIAGNOSIS — H40013 Open angle with borderline findings, low risk, bilateral: Secondary | ICD-10-CM | POA: Diagnosis not present

## 2024-01-25 DIAGNOSIS — H26492 Other secondary cataract, left eye: Secondary | ICD-10-CM | POA: Diagnosis not present

## 2024-01-25 DIAGNOSIS — Z961 Presence of intraocular lens: Secondary | ICD-10-CM | POA: Diagnosis not present

## 2024-01-25 DIAGNOSIS — H18413 Arcus senilis, bilateral: Secondary | ICD-10-CM | POA: Diagnosis not present

## 2024-01-25 MED ORDER — PREDNISOLONE ACETATE 1 % OP SUSP
1.0000 [drp] | Freq: Three times a day (TID) | OPHTHALMIC | 0 refills | Status: AC
  Filled 2024-01-25: qty 5, 30d supply, fill #0

## 2024-02-02 DIAGNOSIS — Z961 Presence of intraocular lens: Secondary | ICD-10-CM | POA: Diagnosis not present

## 2024-02-03 DIAGNOSIS — S59901A Unspecified injury of right elbow, initial encounter: Secondary | ICD-10-CM | POA: Diagnosis not present

## 2024-02-03 DIAGNOSIS — M25521 Pain in right elbow: Secondary | ICD-10-CM | POA: Diagnosis not present

## 2024-02-03 DIAGNOSIS — M7021 Olecranon bursitis, right elbow: Secondary | ICD-10-CM | POA: Diagnosis not present

## 2024-02-11 ENCOUNTER — Other Ambulatory Visit (HOSPITAL_BASED_OUTPATIENT_CLINIC_OR_DEPARTMENT_OTHER): Payer: Self-pay

## 2024-02-11 DIAGNOSIS — Z1231 Encounter for screening mammogram for malignant neoplasm of breast: Secondary | ICD-10-CM | POA: Diagnosis not present

## 2024-02-11 DIAGNOSIS — Z01419 Encounter for gynecological examination (general) (routine) without abnormal findings: Secondary | ICD-10-CM | POA: Diagnosis not present

## 2024-02-11 LAB — HM MAMMOGRAPHY

## 2024-02-11 MED ORDER — MELOXICAM 7.5 MG PO TABS
ORAL_TABLET | ORAL | 2 refills | Status: AC
  Filled 2024-02-11: qty 60, 30d supply, fill #0

## 2024-02-15 DIAGNOSIS — H26491 Other secondary cataract, right eye: Secondary | ICD-10-CM | POA: Diagnosis not present

## 2024-02-16 ENCOUNTER — Ambulatory Visit: Payer: Self-pay | Admitting: Family Medicine

## 2024-02-16 ENCOUNTER — Ambulatory Visit: Admitting: Family Medicine

## 2024-02-16 ENCOUNTER — Encounter: Payer: Self-pay | Admitting: Family Medicine

## 2024-02-16 VITALS — BP 122/72 | HR 63 | Temp 98.1°F | Ht 64.5 in | Wt 119.8 lb

## 2024-02-16 DIAGNOSIS — Z0000 Encounter for general adult medical examination without abnormal findings: Secondary | ICD-10-CM

## 2024-02-16 DIAGNOSIS — E538 Deficiency of other specified B group vitamins: Secondary | ICD-10-CM

## 2024-02-16 DIAGNOSIS — E559 Vitamin D deficiency, unspecified: Secondary | ICD-10-CM | POA: Diagnosis not present

## 2024-02-16 DIAGNOSIS — Z23 Encounter for immunization: Secondary | ICD-10-CM

## 2024-02-16 DIAGNOSIS — E782 Mixed hyperlipidemia: Secondary | ICD-10-CM | POA: Diagnosis not present

## 2024-02-16 DIAGNOSIS — Z1211 Encounter for screening for malignant neoplasm of colon: Secondary | ICD-10-CM

## 2024-02-16 LAB — LIPID PANEL
Cholesterol: 156 mg/dL (ref 0–200)
HDL: 63.1 mg/dL (ref >39.00)
LDL Cholesterol: 61 mg/dL (ref 0–99)
NonHDL: 92.56
Total CHOL/HDL Ratio: 2
Triglycerides: 160 mg/dL — ABNORMAL HIGH (ref 0.0–149.0)
VLDL: 32 mg/dL (ref 0.0–40.0)

## 2024-02-16 LAB — COMPREHENSIVE METABOLIC PANEL WITH GFR
ALT: 11 U/L (ref 0–35)
AST: 17 U/L (ref 0–37)
Albumin: 4.2 g/dL (ref 3.5–5.2)
Alkaline Phosphatase: 56 U/L (ref 39–117)
BUN: 11 mg/dL (ref 6–23)
CO2: 27 meq/L (ref 19–32)
Calcium: 9.4 mg/dL (ref 8.4–10.5)
Chloride: 108 meq/L (ref 96–112)
Creatinine, Ser: 0.69 mg/dL (ref 0.40–1.20)
GFR: 84.64 mL/min (ref >60.00)
Glucose, Bld: 93 mg/dL (ref 70–99)
Potassium: 4.7 meq/L (ref 3.5–5.1)
Sodium: 141 meq/L (ref 135–145)
Total Bilirubin: 0.5 mg/dL (ref 0.2–1.2)
Total Protein: 7.1 g/dL (ref 6.0–8.3)

## 2024-02-16 LAB — CBC WITH DIFFERENTIAL/PLATELET
Basophils Absolute: 0 K/uL (ref 0.0–0.1)
Basophils Relative: 0.8 % (ref 0.0–3.0)
Eosinophils Absolute: 0 K/uL (ref 0.0–0.7)
Eosinophils Relative: 1.3 % (ref 0.0–5.0)
HCT: 38.8 % (ref 36.0–46.0)
Hemoglobin: 12.7 g/dL (ref 12.0–15.0)
Lymphocytes Relative: 39.5 % (ref 12.0–46.0)
Lymphs Abs: 1.3 K/uL (ref 0.7–4.0)
MCHC: 32.7 g/dL (ref 30.0–36.0)
MCV: 87.7 fl (ref 78.0–100.0)
Monocytes Absolute: 0.3 K/uL (ref 0.1–1.0)
Monocytes Relative: 9.1 % (ref 3.0–12.0)
Neutro Abs: 1.6 K/uL (ref 1.4–7.7)
Neutrophils Relative %: 49.3 % (ref 43.0–77.0)
Platelets: 215 K/uL (ref 150.0–400.0)
RBC: 4.42 Mil/uL (ref 3.87–5.11)
RDW: 13.7 % (ref 11.5–15.5)
WBC: 3.3 K/uL — ABNORMAL LOW (ref 4.0–10.5)

## 2024-02-16 LAB — TSH: TSH: 2.24 u[IU]/mL (ref 0.35–5.50)

## 2024-02-16 LAB — VITAMIN D 25 HYDROXY (VIT D DEFICIENCY, FRACTURES): VITD: 31.29 ng/mL (ref 30.00–100.00)

## 2024-02-16 LAB — VITAMIN B12: Vitamin B-12: 829 pg/mL (ref 211–911)

## 2024-02-16 NOTE — Progress Notes (Signed)
 Phone [phone]   Subjective:  Patient presents today for their annual physical. Chief complaint-noted.   See problem oriented charting- ROS- full  review of systems was completed and negative except for topics noted under acute/chronic concerns   The following were reviewed and entered/updated in epic: Past Medical History:  Diagnosis Date   Hyperlipidemia    PUD (peptic ulcer disease)    x6   Patient Active Problem List   Diagnosis Date Noted   B12 deficiency 07/29/2022    Priority: Medium    Essential hypertension 06/07/2019    Priority: Medium    Vitamin D  deficiency 12/30/2016    Priority: Medium    Osteopenia 12/30/2016    Priority: Medium    PVC's (premature ventricular contractions) 01/16/2016    Priority: Medium    Positive hepatitis C antibody test 06/29/2013    Priority: Medium    Hyperlipidemia 08/01/2011    Priority: Medium    Fever blister 03/08/2019    Priority: Low   History of Mohs micrographic surgery for skin cancer 03/24/2018    Priority: Low   Insomnia 06/11/2015    Priority: Low   Mitral valve prolapse 06/11/2015    Priority: Low   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   Past Surgical History:  Procedure Laterality Date   ABDOMINAL HYSTERECTOMY     TAAH, BSO- removed cervix. bladder tack that failed   APPENDECTOMY     Bladder tack     failed   BREAST BIOPSY Right 02/19/2023   US  RT BREAST BX W LOC DEV 1ST LESION IMG BX SPEC US  GUIDE 02/19/2023 [facility]   cataract surgery     TONSILLECTOMY      Family History  Problem Relation Age of Onset   Heart disease Mother        MVR; MI 28   Breast cancer Mother        in 1s   Heart disease Father        Pacemaker    Medications- reviewed and  updated Current Outpatient Medications  Medication Sig Dispense Refill   amLODipine  (NORVASC ) 5 MG tablet TAKE 1 TABLET (5 MG TOTAL) BY MOUTH DAILY. 90 tablet 1   aspirin EC 81 MG tablet Take 81 mg by mouth daily. Swallow whole.     estradiol  (ESTRACE ) 1 MG tablet Take 1.5 tablets (1.5 mg total) by mouth daily. 135 tablet 2   meloxicam  (MOBIC ) 7.5 MG tablet one tablet every 12 hours as needed for back pain 60 tablet 2   Multiple Vitamins-Minerals (CENTRUM SILVER PO) Take by mouth.     pantoprazole  (PROTONIX ) 40 MG tablet Take 1 tablet (40 mg total) by mouth daily. 90 tablet 3   prednisoLONE  acetate (PRED FORTE ) 1 % ophthalmic suspension Place 1 drop into both eyes 3 (three) times daily for 5 days then stop 5 mL 0   rosuvastatin  (CRESTOR ) 10 MG tablet Take 1 tablet (10 mg total) by mouth daily. 90 tablet 3   valACYclovir  (VALTREX ) 1000 MG tablet TAKE 2 TABLETS BY MOUTH TWICE A DAY FOR 1 DAY AT FIRST SIGN OF COLD SORE 90 tablet 1   No current facility-administered medications for this visit.    Allergies-reviewed and  updated No Known Allergies  Social History   Social History Narrative   Divorced. 2 children (daughter 43, son 62 in 2017). 2 grandchildren from daughter 7233 month old rankin, mckenzie 1 month in 2017).    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 Oropharynx normal Neck: no thyromegaly CV: RRR no murmurs rubs or gallops Lungs: CTAB no crackles, wheeze, rhonchi Abdomen: soft/nontender/nondistended/normal bowel sounds. No rebound or  guarding.  Ext: no edema Skin: warm, dry Neuro: grossly normal, moves all extremities, PERRLA     Assessment and Plan   78 y.o. female presenting for annual physical.  Health Maintenance counseling: 1. Anticipatory guidance: Patient counseled regarding regular dental exams -q6 months, eye exams - Dr. [place] yearly- laser surgery yesterday with Dr. Meridee and right 2-3 days ago- doing well,  avoiding smoking and second hand smoke , limiting alcohol to 1 beverage per day- maybe 1 every 2 weeks , no illicit drugs .   2. Risk factor reduction:  Advised patient of need for regular exercise and diet rich and fruits and vegetables to reduce risk of heart attack and stroke.  Exercise- at [facility] 3 days a week plus dancing 3 days a week.  Diet/weight management-weight largely stable in last year. Eating reasonably healthy Wt Readings from Last 3 Encounters:  02/16/24 119 lb 12.8 oz (54.3 kg)  08/25/23 114 lb 3.2 oz (51.8 kg)  08/04/23 118 lb (53.5 kg)  3. Immunizations/screenings/ancillary studies Immunization History  Administered Date(s) Administered   Fluad Quad(high Dose 65+) 05/23/2020, 01/15/2021, 03/05/2022   Fluad Trivalent(High Dose 65+) 02/03/2023   INFLUENZA, HIGH DOSE SEASONAL PF 02/16/2024   Influenza-Unspecified 03/04/2017, 01/18/2020   PFIZER(Purple Top)SARS-COV-2 Vaccination 06/16/2019, 07/14/2019, 02/21/2020   PNEUMOCOCCAL CONJUGATE-20 01/28/2022   Pneumococcal Conjugate-13 07/04/2013   Pneumococcal Polysaccharide-23 12/30/2016   Tdap 06/29/2010   Health Maintenance Due  Topic Date Due   Fecal DNA (Cologuard)  12/18/2023   Health Maintenance  Topic Date Due   Fecal DNA (Cologuard)  12/18/2023   COVID-19 Vaccine (4 - 2025-26 season) 03/03/2024 (Originally 01/18/2024)   Zoster Vaccines- Shingrix (1 of 2) 05/17/2024 (Originally 04/10/1965)   Medicare Annual Wellness (AWV)  07/01/2024   Pneumococcal Vaccine: 50+ Years  Completed   Influenza Vaccine  Completed   DEXA SCAN   Completed   Hepatitis C Screening  Completed   HPV VACCINES  Aged Out   Meningococcal B Vaccine  Aged Out   DTaP/Tdap/Td  Discontinued   Mammogram  Discontinued   Colonoscopy  Discontinued   4. Cervical cancer screening- hysterectomy for benign reasons but they decide on pelvic exams with Dr. Rox still 5. Breast cancer screening-  breast exam with Dr. Rox and mammogram 02/04/23 on file but had another one last week which was reassuring as well 6. Colon cancer screening - send cologuard in today- right at 3 years 7. Skin cancer screening- [place] [facility]- sees this week- has spot going to get evaluated on back. advised regular sunscreen  use.  8. Birth control/STD check- postmenopausal/hysterectomy. Long term partner since 2019- doesn't need short term disability screening as monogamous  9. Osteoporosis screening at 65- osteopenia per GYN on centrum silver with calcium  and vitamin D  D plus a lot of good weight bearing exercise. Updated DEXA 08/19/2023 10. Smoking associated screening - former smoker- quit in 1990s no regular screening  Status of chronic or acute concerns   #Fall- dancing and accidentally got baby powder on shoe and caused a slip and fall- thankfully no hip fracture but did get bursitis olecranon- but doing ok- monitoring. Also tweaked her back with this but that has been more lingering issue plus flared up in may after lifting large [place] bags that were heavy. has been prescribed meloxicam  (only taken 4 but helping) through GYN-expressed caution due to renal risks and blood pressure control   #hypertension S: medication: Amlodipine  5 mg BP Readings from Last 3 Encounters:  02/16/24 122/72  08/25/23 120/66  08/04/23 138/70  A/P:  well controlled continue current medications   #hyperlipidemia S: Medication:Rosuvastatin  10 mg daily, she prefers to take aspirin 81 mg  Lab Results  Component Value Date   CHOL 159 02/03/2023   HDL 73.10 02/03/2023   LDLCALC 58 02/03/2023    LDLDIRECT 61.0 07/29/2022   TRIG 139.0 02/03/2023   CHOLHDL 2 02/03/2023   A/P: hopefully stable- update lipid panel today. Continue current meds for now     # GERD S:Medication: Pantoprazole  20 mg--> 40 mg A/P: doing well- continue current medications      # B12 deficiency-potentially related to PPI S: Current treatment/medication (oral vs. IM): 1000 mcg injections at home.  Trial of p.o. starting March 2024 A/P: hopefully stable- update b12 today. Continue current meds for now    #Vitamin D  deficiency S: Medication:  takes mv with MV  Last vitamin D  Lab Results  Component Value Date   VD25OH 38.23 02/03/2023  A/P: looked good last year- hopefully stable   # Hormone replacement therapy-on Estrace  through GYN through Dr. 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 Joane 04/07/2023-offloading cushion in shoes plus Voltaren gel recommended.  Note of considering steroid injection or referral to podiatry -gets some cramping in this area after dancing- mustard helps.    Recommended follow up: Return in about 6 months (around 08/15/2024) for followup or sooner if needed.Schedule b4 you leave. Future Appointments  Date Time Provider Department Center  07/12/2024 11:20 AM [facility] ANNUAL WELLNESS VISIT 1 [facility] [place]   Lab/Order associations: fasting other than coffee with creamer   ICD-10-CM   1. Need for influenza vaccination  Z23 Flu vaccine HIGH DOSE PF(Fluzone Trivalent)    2. Preventative health care  Z00.00     3. Mixed hyperlipidemia  E78.2 Comprehensive metabolic panel with GFR    CBC with Differential/Platelet    Lipid panel    TSH    4. Vitamin D  deficiency  E55.9 VITAMIN D  25 Hydroxy (Vit-D Deficiency, Fractures)    5. B12 deficiency  E53.8 Vitamin B12    6. Screen for colon cancer  Z12.11 Cologuard      No orders of the  defined types were placed in this encounter.   Return precautions advised.  Garnette Lukes, MD

## 2024-02-16 NOTE — Patient Instructions (Addendum)
 Health Maintenance Due  Topic Date Due   Fecal DNA (Cologuard)  12/18/2023  please let us  know if you have not received your Cologuard within 3 weeks-please complete after you receive  Thanks for doing flu shot  Please stop by lab before you go If you have mychart- we will send your results within 3 business days of us  receiving them.  If you do not have mychart- we will call you about results within 5 business days of us  receiving them.  *please also note that you will see labs on mychart as soon as they post. I will later go in and write notes on them- will say notes from Dr. Katrinka   No changes today unless labs lead us  to make changes  Recommended follow up: Return in about 6 months (around 08/15/2024) for followup or sooner if needed.Schedule b4 you leave.

## 2024-02-23 DIAGNOSIS — L821 Other seborrheic keratosis: Secondary | ICD-10-CM | POA: Diagnosis not present

## 2024-02-23 DIAGNOSIS — L578 Other skin changes due to chronic exposure to nonionizing radiation: Secondary | ICD-10-CM | POA: Diagnosis not present

## 2024-02-23 DIAGNOSIS — Z85828 Personal history of other malignant neoplasm of skin: Secondary | ICD-10-CM | POA: Diagnosis not present

## 2024-02-23 DIAGNOSIS — M7021 Olecranon bursitis, right elbow: Secondary | ICD-10-CM | POA: Diagnosis not present

## 2024-02-23 DIAGNOSIS — Z961 Presence of intraocular lens: Secondary | ICD-10-CM | POA: Diagnosis not present

## 2024-03-03 DIAGNOSIS — M7021 Olecranon bursitis, right elbow: Secondary | ICD-10-CM | POA: Diagnosis not present

## 2024-03-03 DIAGNOSIS — Z1211 Encounter for screening for malignant neoplasm of colon: Secondary | ICD-10-CM | POA: Diagnosis not present

## 2024-03-04 ENCOUNTER — Encounter: Payer: Self-pay | Admitting: Family Medicine

## 2024-03-11 ENCOUNTER — Other Ambulatory Visit: Payer: Self-pay

## 2024-03-16 LAB — COLOGUARD: COLOGUARD: NEGATIVE

## 2024-03-18 ENCOUNTER — Other Ambulatory Visit: Payer: Self-pay | Admitting: Family Medicine

## 2024-03-21 ENCOUNTER — Other Ambulatory Visit: Payer: Self-pay | Admitting: Family Medicine

## 2024-03-21 ENCOUNTER — Other Ambulatory Visit (HOSPITAL_BASED_OUTPATIENT_CLINIC_OR_DEPARTMENT_OTHER): Payer: Self-pay

## 2024-03-21 MED ORDER — VALACYCLOVIR HCL 1 G PO TABS
ORAL_TABLET | ORAL | 1 refills | Status: AC
  Filled 2024-03-21: qty 90, 45d supply, fill #0

## 2024-03-21 MED ORDER — AMLODIPINE BESYLATE 5 MG PO TABS
5.0000 mg | ORAL_TABLET | Freq: Every day | ORAL | 1 refills | Status: AC
  Filled 2024-03-21: qty 90, 90d supply, fill #0
  Filled 2024-06-17: qty 90, 90d supply, fill #1

## 2024-04-04 ENCOUNTER — Other Ambulatory Visit (HOSPITAL_BASED_OUTPATIENT_CLINIC_OR_DEPARTMENT_OTHER): Payer: Self-pay

## 2024-04-04 DIAGNOSIS — M7021 Olecranon bursitis, right elbow: Secondary | ICD-10-CM | POA: Diagnosis not present

## 2024-04-04 DIAGNOSIS — M25721 Osteophyte, right elbow: Secondary | ICD-10-CM | POA: Diagnosis not present

## 2024-04-04 MED ORDER — HYDROCODONE-ACETAMINOPHEN 5-325 MG PO TABS
1.0000 | ORAL_TABLET | Freq: Three times a day (TID) | ORAL | 0 refills | Status: AC | PRN
  Filled 2024-04-04: qty 21, 7d supply, fill #0

## 2024-05-11 ENCOUNTER — Other Ambulatory Visit (HOSPITAL_BASED_OUTPATIENT_CLINIC_OR_DEPARTMENT_OTHER): Payer: Self-pay

## 2024-07-12 ENCOUNTER — Ambulatory Visit: Payer: PPO

## 2024-08-15 ENCOUNTER — Ambulatory Visit: Admitting: Family Medicine

## 2024-08-23 ENCOUNTER — Ambulatory Visit: Admitting: Family Medicine
